# Patient Record
Sex: Female | Born: 1960 | Race: Black or African American | Hispanic: No | Smoking: Never smoker
Health system: Southern US, Community
[De-identification: ages and names within clinical notes are randomized; demographics above are authoritative.]

## PROBLEM LIST (undated history)

## (undated) DIAGNOSIS — M1990 Unspecified osteoarthritis, unspecified site: Secondary | ICD-10-CM

## (undated) DIAGNOSIS — D649 Anemia, unspecified: Secondary | ICD-10-CM

## (undated) DIAGNOSIS — I10 Essential (primary) hypertension: Secondary | ICD-10-CM

## (undated) DIAGNOSIS — F419 Anxiety disorder, unspecified: Secondary | ICD-10-CM

## (undated) HISTORY — PX: EYE SURGERY: SHX253

## (undated) HISTORY — PX: MYOMECTOMY: SHX85

## (undated) HISTORY — PX: COLONOSCOPY: SHX174

## (undated) HISTORY — PX: OTHER SURGICAL HISTORY: SHX169

---

## 1987-03-24 HISTORY — PX: TUBAL LIGATION: SHX77

## 1997-09-25 ENCOUNTER — Other Ambulatory Visit: Admission: RE | Admit: 1997-09-25 | Discharge: 1997-09-25 | Payer: Self-pay | Admitting: Obstetrics and Gynecology

## 1999-02-11 ENCOUNTER — Other Ambulatory Visit: Admission: RE | Admit: 1999-02-11 | Discharge: 1999-02-11 | Payer: Self-pay | Admitting: Obstetrics and Gynecology

## 2000-05-10 ENCOUNTER — Other Ambulatory Visit: Admission: RE | Admit: 2000-05-10 | Discharge: 2000-05-10 | Payer: Self-pay | Admitting: Obstetrics and Gynecology

## 2000-06-28 ENCOUNTER — Other Ambulatory Visit: Admission: RE | Admit: 2000-06-28 | Discharge: 2000-06-28 | Payer: Self-pay | Admitting: Obstetrics and Gynecology

## 2001-09-06 ENCOUNTER — Other Ambulatory Visit: Admission: RE | Admit: 2001-09-06 | Discharge: 2001-09-06 | Payer: Self-pay | Admitting: Obstetrics and Gynecology

## 2003-06-28 ENCOUNTER — Other Ambulatory Visit: Admission: RE | Admit: 2003-06-28 | Discharge: 2003-06-28 | Payer: Self-pay | Admitting: Obstetrics and Gynecology

## 2004-11-05 ENCOUNTER — Other Ambulatory Visit: Admission: RE | Admit: 2004-11-05 | Discharge: 2004-11-05 | Payer: Self-pay | Admitting: Obstetrics and Gynecology

## 2005-01-13 ENCOUNTER — Inpatient Hospital Stay (HOSPITAL_COMMUNITY): Admission: RE | Admit: 2005-01-13 | Discharge: 2005-01-15 | Payer: Self-pay | Admitting: Obstetrics and Gynecology

## 2005-01-13 ENCOUNTER — Encounter (INDEPENDENT_AMBULATORY_CARE_PROVIDER_SITE_OTHER): Payer: Self-pay | Admitting: Specialist

## 2005-12-09 ENCOUNTER — Other Ambulatory Visit: Admission: RE | Admit: 2005-12-09 | Discharge: 2005-12-09 | Payer: Self-pay | Admitting: Obstetrics and Gynecology

## 2006-05-26 ENCOUNTER — Ambulatory Visit: Payer: Self-pay | Admitting: Family Medicine

## 2007-03-14 ENCOUNTER — Ambulatory Visit: Payer: Self-pay | Admitting: Internal Medicine

## 2007-03-14 DIAGNOSIS — M25569 Pain in unspecified knee: Secondary | ICD-10-CM | POA: Insufficient documentation

## 2007-03-15 ENCOUNTER — Ambulatory Visit: Payer: Self-pay | Admitting: Internal Medicine

## 2007-03-21 ENCOUNTER — Telehealth: Payer: Self-pay | Admitting: Internal Medicine

## 2007-04-05 ENCOUNTER — Encounter: Payer: Self-pay | Admitting: Internal Medicine

## 2009-04-16 ENCOUNTER — Ambulatory Visit: Payer: Self-pay | Admitting: Internal Medicine

## 2009-04-16 DIAGNOSIS — J069 Acute upper respiratory infection, unspecified: Secondary | ICD-10-CM | POA: Insufficient documentation

## 2009-04-18 ENCOUNTER — Ambulatory Visit: Payer: Self-pay | Admitting: Internal Medicine

## 2009-04-18 ENCOUNTER — Telehealth: Payer: Self-pay | Admitting: Internal Medicine

## 2009-04-19 ENCOUNTER — Encounter: Payer: Self-pay | Admitting: Internal Medicine

## 2010-04-22 NOTE — Progress Notes (Signed)
Summary: shortness of breath  Phone Note Call from Patient Call back at [phone]   Caller: Patient------voice mail Reason for Call: Talk to Nurse Summary of Call: Was seen on Tuesday with bronchitis. Having shortness of breath. Pharmacy is [facility] on [address]. Please advise pt. Initial call taken by: Warnell Forester,  April 18, 2009 10:35 AM  Follow-up for Phone Call        coming in for appt Follow-up by: Raechel Ache, RN,  April 18, 2009 11:40 AM    generic z pack- 250 mg 5 day  (#6) ROV today if  worsens or desired

## 2010-04-22 NOTE — Assessment & Plan Note (Signed)
Summary: F/U ON CONGESTION, SOB // RS   Vital Signs:  Patient profile:   50 year old female O2 Sat:      100 % on Room air Temp:     98.9 degrees F oral Pulse rate:   84 / minute BP sitting:   102 / 84  (left arm) Cuff size:   regular  Vitals Entered By: Raechel Ache, RN (April 18, 2009 11:37 AM)  O2 Flow:  Room air CC: C/o SOB and still coughing.   CC:  C/o SOB and still coughing.Marland Kitchen  History of Present Illness: 51 year old patient who was seen two days ago and treated symptomatically for a URI.  For the past 24 hours.  She has noted increasing shortness of breath.  She still has a significant paroxysms of coughing, which remained nonproductive.  There's been no fever or chills.  She is considerable dyspnea with exertion.  Allergies: 1)  ! Pcn  Past History:  Past Medical History: Reviewed history from 03/14/2007 and no changes required. High Cholesterol gravida two, para two, abortus zero  Review of Systems       The patient complains of anorexia, dyspnea on exertion, and prolonged cough.  The patient denies fever, weight loss, weight gain, vision loss, decreased hearing, hoarseness, chest pain, syncope, peripheral edema, headaches, hemoptysis, abdominal pain, melena, severe indigestion/heartburn, hematuria, incontinence, genital sores, muscle weakness, suspicious skin lesions, transient blindness, difficulty walking, depression, unusual weight change, abnormal bleeding, enlarged lymph nodes, angioedema, and breast masses.    Physical Exam  General:  Well-developed,well-nourished,in no acute distress; alert,appropriate and cooperative throughout examination Head:  Normocephalic and atraumatic without obvious abnormalities. No apparent alopecia or balding. Eyes:  No corneal or conjunctival inflammation noted. EOMI. Perrla. Funduscopic exam benign, without hemorrhages, exudates or papilledema. Vision grossly normal. Ears:  External ear exam shows no significant lesions or  deformities.  Otoscopic examination reveals clear canals, tympanic membranes are intact bilaterally without bulging, retraction, inflammation or discharge. Hearing is grossly normal bilaterally. Mouth:  Oral mucosa and oropharynx without lesions or exudates.  Teeth in good repair. Neck:  No deformities, masses, or tenderness noted. Lungs:  patient has a mild resting tachypnea; she has frequent paroxysms of coughing and appears to be slightly anxious; her O2 saturation is 99 to a hundred with a resting pulse rate of 80  chest remains clear Heart:  Normal rate and regular rhythm. S1 and S2 normal without gallop, murmur, click, rub or other extra sounds. no tachycardia Abdomen:  Bowel sounds positive,abdomen soft and non-tender without masses, organomegaly or hernias noted. Msk:  No deformity or scoliosis noted of thoracic or lumbar spine.     Impression & Recommendations:  Problem # 1:  URI (ICD-465.9)  Her updated medication list for this problem includes:    Hydrocodone-homatropine 5-1.5 Mg/62ml Syrp (Hydrocodone-homatropine) .Marland Kitchen... 1 teaspoon every 6 hours as needed for cough    Her updated medication list for this problem includes:    Hydrocodone-homatropine 5-1.5 Mg/13ml Syrp (Hydrocodone-homatropine) .Marland Kitchen... 1 teaspoon every 6 hours as needed for cough  Orders: T-2 View CXR (71020TC)  Complete Medication List: 1)  Hydrocodone-homatropine 5-1.5 Mg/43ml Syrp (Hydrocodone-homatropine) .Marland Kitchen.. 1 teaspoon every 6 hours as needed for cough  Patient Instructions: 1)  Get plenty of rest, drink lots of clear liquids, and use Tylenol or Ibuprofen for fever and comfort. Return in 7-10 days if you're not better:sooner if you're feeling worse. 2)  chest x ray 3)  Dulera  one puff twice daily (do not  exceed)

## 2010-04-22 NOTE — Progress Notes (Signed)
  Phone Note Call from Patient   Caller: Patient Call For: k Summary of Call: needs note for work for Tues - Friday, pls mail to home Initial call taken by: Raechel Ache, RN,  April 18, 2009 2:48 PM  Follow-up for Phone Call        ok Follow-up by: Gordy Savers  MD,  April 19, 2009 12:36 PM  Additional Follow-up for Phone Call Additional follow up Details #1::        mailed. Additional Follow-up by: Raechel Ache, RN,  April 19, 2009 1:01 PM

## 2010-04-22 NOTE — Letter (Signed)
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   [place], [place] [address]   Phone: [phone]  Fax: [phone]    April 19, 2009   Employee:  Samantha Burnett    To Whom It May Concern:   For Medical reasons, please excuse the above named employee from work for the following dates:  Start:   04-16-2009  End:   04-20-2009  If you need additional information, please feel free to contact our office.         Sincerely,    Gordy Savers  MD

## 2010-04-22 NOTE — Assessment & Plan Note (Signed)
Summary: COUGH, CONGESTION // RS   Vital Signs:  Patient profile:   50 year old female Weight:      189 pounds Temp:     99.6 degrees F oral BP sitting:   100 / 76  (left arm) Cuff size:   regular  Vitals Entered By: Raechel Ache, RN (April 16, 2009 9:29 AM) CC: Got sick at 3am with fever, sore chest and productive cough.   CC:  Got sick at 3am with fever and sore chest and productive cough..  History of Present Illness: 50 year old patient who had the abrupt onset of fever and cough earlier today.  cough has  been large and nonproductive.  She is oriented developed some chest soreness related to the refractory cough.  She has had fever and some associated mild headache.  Denies any shortness of breath or significant purulent sputum production.  No pleurisy  Allergies: 1)  ! Pcn  Past History:  Past Medical History: Reviewed history from 03/14/2007 and no changes required. High Cholesterol gravida two, para two, abortus zero  Review of Systems       The patient complains of prolonged cough.  The patient denies anorexia, fever, weight loss, weight gain, vision loss, decreased hearing, hoarseness, chest pain, syncope, dyspnea on exertion, peripheral edema, headaches, hemoptysis, abdominal pain, melena, hematochezia, severe indigestion/heartburn, hematuria, incontinence, genital sores, muscle weakness, suspicious skin lesions, transient blindness, difficulty walking, depression, unusual weight change, abnormal bleeding, enlarged lymph nodes, angioedema, and breast masses.    Physical Exam  General:  overweight-appearing.  normal blood pressureoverweight-appearing.   Head:  Normocephalic and atraumatic without obvious abnormalities. No apparent alopecia or balding. Eyes:  No corneal or conjunctival inflammation noted. EOMI. Perrla. Funduscopic exam benign, without hemorrhages, exudates or papilledema. Vision grossly normal. Ears:  External ear exam shows no significant lesions  or deformities.  Otoscopic examination reveals clear canals, tympanic membranes are intact bilaterally without bulging, retraction, inflammation or discharge. Hearing is grossly normal bilaterally. Mouth:  Oral mucosa and oropharynx without lesions or exudates.  Teeth in good repair. Neck:  No deformities, masses, or tenderness noted. Chest Wall:  No deformities, masses, or tenderness noted. Lungs:  Normal respiratory effort, chest expands symmetrically. Lungs are clear to auscultation, no crackles or wheezes. Heart:  Normal rate and regular rhythm. S1 and S2 normal without gallop, murmur, click, rub or other extra sounds.   Impression & Recommendations:  Problem # 1:  URI (ICD-465.9)  The following medications were removed from the medication list:    Celebrex 200 Mg Caps (Celecoxib) ..... One twice daily Her updated medication list for this problem includes:    Hydrocodone-homatropine 5-1.5 Mg/33ml Syrp (Hydrocodone-homatropine) .Marland Kitchen... 1 teaspoon every 6 hours as needed for cough  The following medications were removed from the medication list:    Celebrex 200 Mg Caps (Celecoxib) ..... One twice daily Her updated medication list for this problem includes:    Hydrocodone-homatropine 5-1.5 Mg/51ml Syrp (Hydrocodone-homatropine) .Marland Kitchen... 1 teaspoon every 6 hours as needed for cough  Complete Medication List: 1)  Hydrocodone-homatropine 5-1.5 Mg/25ml Syrp (Hydrocodone-homatropine) .Marland Kitchen.. 1 teaspoon every 6 hours as needed for cough  Patient Instructions: 1)  Get plenty of rest, drink lots of clear liquids, and use Tylenol or Ibuprofen for fever and comfort. Return in 7-10 days if you're not better:sooner if you're feeling worse. 2)  MUCINEX DM ONE TWICE  DAILY Prescriptions: HYDROCODONE-HOMATROPINE 5-1.5 MG/5ML SYRP (HYDROCODONE-HOMATROPINE) 1 teaspoon every 6 hours as needed for cough  #6 oz  x 2   Entered and Authorized by:   Gordy Savers  MD   Signed by:   Gordy Savers  MD on  04/16/2009   Method used:   Print then Give to Patient   RxID:   [id number]

## 2010-08-08 NOTE — Op Note (Signed)
NAMEJOSEPHINE, WOOLDRIDGE                 ACCOUNT NO.:  [id number]   MEDICAL RECORD NO.:  [id number]          PATIENT TYPE:  INP   LOCATION:  9399                          FACILITY:  [facility]   PHYSICIAN:  Janine Limbo, M.D.DATE OF BIRTH:  [birth year]   DATE OF PROCEDURE:  01/13/2005  DATE OF DISCHARGE:                                 OPERATIVE REPORT   PREOPERATIVE DIAGNOSES:  1.  Fibroid uterus.  2.  Menorrhagia  3.  Dysmenorrhea.   POSTOPERATIVE DIAGNOSE:  1.  Fibroid uterus.  2.  Menorrhagia  3.  Dysmenorrhea.  4.  Adenomyosis.   PROCEDURE:  Abdominal myomectomy.   SURGEON:  Dr. Leonard Schwartz   FIRST ASSISTANT:  Osborn Coho, MD.   ANESTHETIC:  General.   DISPOSITION:  Ms. Lamour is a 50 year old female, para 2-0-0-2, who presents  for an abdominal myomectomy.  She has had a prior tubal ligation, but she  does not desire sterilization.  We talked about options for management which  include uterine artery embolization, hysterectomy, myomectomy, endometrial  ablation, and therapy with oral contraceptives or other hormonal-type  medications.  The patient has elected to proceed with myomectomy.  She  understands the indications for her surgical procedure and she accepts the  risks of, but not limited to, anesthetic complications, bleeding,  infections, and possible damage to the surrounding organs.   FINDINGS:  A total of 4 fibroids were removed.  The largest fibroid measured  3.5 cm in size.  The fallopian tubes and the ovaries were normal except for  defects in the fallopian tubes from her prior tubal ligation.  The remainder  of the bowel appeared normal.  There was thickening in the midportion of the  uterus, and this was thought to be consistent with adenomyosis.   PROCEDURE:  The patient was taken to the operating room where a general  anesthetic was given.  The patient's abdomen, perineum, and vagina were  prepped with multiple layers of Betadine.  A Foley  catheter was placed in  the bladder.  The patient was then sterilely draped.  The lower abdomen was  injected with 10 mL of 0.5% Marcaine with epinephrine.  A low transverse  incision was made and carried sharply through the subcutaneous tissue, the  fascia, and the anterior peritoneum.  The uterus was elevated into our  operative field.  Four fibroids were noted.  Two of the fibroids were  present on the anterior surface of the uterus and were subserosal in  location.  One fibroid was on the right fallopian tube, and one fibroid was  on the left fallopian tube.  The anterior uterus was injected with a diluted  solution of Pitressin and saline.  An incision was made in the midportion of  the uterus.  The incision was extended under the serosa to the level of the  fibroid.  Using a combination of sharp and blunt dissection, the fibroid was  removed to the right of that midline.  A similar technique was used to  remove the fibroid to the left of the midline.  There  was bleeding noted in  the crater of the uterus.  The uterus was then closed in layers using figure-  of-eight sutures of 0 Vicryl followed by a running suture of 3-0 Vicryl on  the serosa of the uterus.  Hemostasis was noted to be adequate.  The  fibroids on the fallopian tubes were removed using a combination of sharp  and blunt dissection.  Hemostasis was achieved using the bipolar cautery.  At this point, we felt that no other fibroids were present that needed to be  removed.  A sample of the midportion of the uterus had previously been  obtained to evaluate possible adenomyosis.  The pelvis was vigorously  irrigated.  Hemostasis was noted to be adequate.  All instruments were then  removed.  Intercede was placed on the anterior portion of the uterus  covering the incisions.  The anterior peritoneum was closed using a running  suture of 0 Vicryl.  The abdominal musculature was reapproximated in the  midline.  The fascia and the  subcutaneous layer were irrigated.  The fascia  was closed using a running suture of 0 Vicryl from the corners to the  midline in both directions.  The subcutaneous layer was irrigated.  A  Jackson-Pratt drain was placed in the subcutaneous layer and brought out  through the right lower quadrant.  It was sutured into place using 3-0 silk.  The subcutaneous layer was closed using 0 Vicryl.  The skin was  reapproximated using a subcuticular suture of 3-0 Monocryl.  Sponge, needle,  and instrument counts were correct on two occasions.  The estimated blood  loss was 100 mL.  The patient was noted to drain clear yellow urine at the  end of her procedure.  She was awakened from her anesthetic without  difficulty and taken to the recovery room in stable condition.      Janine Limbo, M.D.  Electronically Signed     AVS/MEDQ  D:  01/13/2005  T:  01/13/2005  Job:  [id number]

## 2010-08-08 NOTE — H&P (Signed)
NAMEBERTIE, MCCONATHY                 ACCOUNT NO.:  [id number]   MEDICAL RECORD NO.:  [id number]          PATIENT TYPE:  INP   LOCATION:  NA                            FACILITY:  [facility]   PHYSICIAN:  Janine Limbo, M.D.DATE OF BIRTH:  [birth year]   DATE OF ADMISSION:  01/13/2005  DATE OF DISCHARGE:                                HISTORY & PHYSICAL   HISTORY OF PRESENT ILLNESS:  Ms. Dohner is a 50 year old female, para 2-0-0-  2, who presents for an abdominal myomectomy because of fibroids,  menorrhagia, and dysmenorrhea.  The patient had an endometrial biopsy that  showed benign elements.  Her most recent Pap smear was within normal limits.   OBSTETRICAL HISTORY:  The patient has had 2 term vaginal delivery.   DRUG ALLERGIES:  PENICILLIN.   PAST MEDICAL HISTORY:  The patient was told that she had hypertension in the  past associated with birth control pills.  She has fibroids as mentioned  above.   SOCIAL HISTORY:  The patient denies cigarette use, alcohol use, and  recreational drug use.   REVIEW OF SYSTEMS:  Noncontributory.   FAMILY HISTORY:  The patient's grandfather had diabetes.  Her father had  cancer.  Her aunt and her sister have high blood pressure.   PHYSICAL EXAMINATION:  VITAL SIGNS:  Weight 188 pounds.  Height is 5 feet  1/2 inch.  HEENT: Within normal limits.  CHEST: Clear.  HEART: Regular rate and rhythm.  BREASTS:  Without masses.  ABDOMEN:  Nontender.  EXTREMITIES:  Grossly normal.  NEUROLOGIC:  Grossly normal.  PELVIC:  External genitalia normal.  Vagina normal.  Cervix is nontender.  Uterus is 10-week size, firm, slightly irregular.  Adnexal no masses.  Rectovaginal confirms.   LABORATORY VALUES:  Ultrasound showed a 10.7 x 6.2 cm uterus with fibroids  measuring 3.4 x 2.8 cm, 2.2 x 2.5 cm, and 1.4 x 1.1 cm.   GC negative, chlamydia negative, HIV negative, RPR negative, HBsAg negative.   ASSESSMENT:  1.  Menorrhagia.  2.  Dysmenorrhea.  3.  Fibroid  uterus.   PLAN:  The patient will undergo an abdominal myomectomy.  She understands  her options for management which include hysterectomy.  She does not want a  hysterectomy at this time.      Janine Limbo, M.D.  Electronically Signed     AVS/MEDQ  D:  01/12/2005  T:  01/12/2005  Job:  [id number]

## 2010-08-08 NOTE — Discharge Summary (Signed)
Samantha Burnett, Samantha Burnett                 ACCOUNT NO.:  [id number]   MEDICAL RECORD NO.:  [id number]          PATIENT TYPE:  INP   LOCATION:  9317                          FACILITY:  [facility]   PHYSICIAN:  Janine Limbo, M.D.DATE OF BIRTH:  1960-12-31   DATE OF ADMISSION:  01/13/2005  DATE OF DISCHARGE:  01/15/2005                                 DISCHARGE SUMMARY   DISCHARGE DIAGNOSIS:  Fibroid uterus, menorrhagia, dysmenorrhea.   OPERATION:  On the day of admission, the patient underwent an abdominal  myomectomy, tolerating procedure well. A total of four fibroids were removed  with the largest fibroid measuring 3.5 cm in size. The fallopian tubes and  the ovaries were normal except for defects in the fallopian tubes from her  prior tubal ligation. The remainder of her bowels appeared normal. There was  thickening in the mid portion of the uterus and this was thought to be  consistent with adenomyosis.   HISTORY OF PRESENT ILLNESS:  Samantha Burnett is a 50 year old female para 2-0-0-2  who presents for an abdominal myomectomy because of symptomatic uterine  fibroids. Please see the patient's dictated history and physical examination  for details.   PREOPERATIVE PHYSICAL EXAMINATION:  VITAL SIGNS:  Weight is 188 pounds,  height is 5 feet one-half inches tall.  GENERAL:  Within normal limits.  PELVIC:  External genitalia was normal. Vagina normal. Cervix was nontender.  Uterus 10 weeks size, firm and slightly irregular. Adnexa no masses.  Rectovaginal exam confirm.   HOSPITAL COURSE:  On the day of admission the patient underwent  aforementioned procedure, tolerating it well. Postoperative course was  unremarkable, with the patient tolerating a postoperative hemoglobin of 12.0  (preoperative hemoglobin 13.8). By postoperative day #2, the patient had  resumed bowel and bladder function and was therefore deemed ready for  discharge home.   DISCHARGE MEDICATIONS:  1.  Vicodin one or two  tablets every 4 hours as needed for pain.  2.  Ibuprofen 600 mg one tablet with food every 6 hours for 3 days, then as      needed for pain.  3.  Phenergan 25 mg one tablet every 6 hours as needed for nausea.  4.  Colace 100 mg one tablet twice daily until bowel movements are regular.   FOLLOW-UP:  The patient is to call [facility] in 1 week for  scheduling of removal of her JP drain. She has a 6 weeks postoperative visit  with Dr. Stefano Gaul which also should be scheduled at that time.   DISCHARGE INSTRUCTIONS:  The patient was given a copy of [facility] postoperative instruction sheet. She was further advised to avoid  driving for 2 weeks, heavy lifting for 4 weeks, intercourse for 6 weeks,  that she may shower, that she may walk up steps, and that she should  definitely keep her incision clean and dry. The patient's diet was without  restriction.   FINAL PATHOLOGY:  Uterus biopsy:  Benign myometrium, no adenomyosis  identified. Uterus myomectomy:  Fragments of leiomyoma, a single fragment of  smooth muscle  with granular epithelium.      [place]mira J. Adline Peals.      Janine Limbo, M.D.  Electronically Signed    EJP/MEDQ  D:  02/06/2005  T:  02/06/2005  Job:  [id number]

## 2011-08-19 ENCOUNTER — Telehealth: Payer: Self-pay | Admitting: Obstetrics and Gynecology

## 2011-08-19 ENCOUNTER — Encounter: Payer: Self-pay | Admitting: Obstetrics and Gynecology

## 2011-08-19 ENCOUNTER — Ambulatory Visit (INDEPENDENT_AMBULATORY_CARE_PROVIDER_SITE_OTHER): Payer: BC Managed Care – HMO | Admitting: Obstetrics and Gynecology

## 2011-08-19 VITALS — BP 108/78 | HR 72 | Ht 60.0 in | Wt 189.0 lb

## 2011-08-19 DIAGNOSIS — N951 Menopausal and female climacteric states: Secondary | ICD-10-CM

## 2011-08-19 DIAGNOSIS — E669 Obesity, unspecified: Secondary | ICD-10-CM | POA: Insufficient documentation

## 2011-08-19 DIAGNOSIS — Z9889 Other specified postprocedural states: Secondary | ICD-10-CM

## 2011-08-19 DIAGNOSIS — I10 Essential (primary) hypertension: Secondary | ICD-10-CM

## 2011-08-19 DIAGNOSIS — Z01419 Encounter for gynecological examination (general) (routine) without abnormal findings: Secondary | ICD-10-CM

## 2011-08-19 DIAGNOSIS — Z124 Encounter for screening for malignant neoplasm of cervix: Secondary | ICD-10-CM

## 2011-08-19 NOTE — Patient Instructions (Signed)

## 2011-08-19 NOTE — Progress Notes (Signed)
Last Pap: 06/14/08 WNL: Yes Regular Periods:yes Contraception: BTL  Monthly Breast exam:yes Tetanus<29yrs:yes Nl.Bladder Function:yes Daily BMs:yes Healthy Diet:no Calcium:yes Mammogram:yes 06/2011 per pt Exercise:yes Seatbelt: yes Abuse at home: no Stressful work:no Sigmoid-colonoscopy: 08/31/2010 per pt Bone Density: No Pt has questions about menopause. Samantha Burnett  Subjective:    Samantha Burnett is a 51 y.o. female,para 2-0-0-2, who presents for an annual exam. See above. The patient had a myomectomy in 08/30/2004. She complains of menopausal symptoms.  Her husband died in 08/30/2008.  She is slowly recovering.  She is not sexually active.  Prior Hysterectomy: No    History   Social History  . Marital Status: Married    Spouse Name: N/A    Number of Children: N/A  . Years of Education: N/A   Social History Main Topics  . Smoking status: Never Smoker   . Smokeless tobacco: Never Used  . Alcohol Use: No  . Drug Use: No  . Sexually Active: Yes    Birth Control/ Protection: Surgical     BTL   Other Topics Concern  . None   Social History Narrative  . None    Menstrual cycle:   LMP: Patient's last menstrual period was 07/15/2011.           Cycle: Regular, monthly with normal flow and no severe dysmenorrha  The following portions of the patient's history were reviewed and updated as appropriate: allergies, current medications, past family history, past medical history, past social history, past surgical history and problem list.  Review of Systems Pertinent items are noted in HPI. Breast:Negative for breast lump,nipple discharge or nipple retraction Gastrointestinal: Negative for abdominal pain, change in bowel habits or rectal bleeding Urinary:negative   Objective:    BP 108/78  Pulse 72  Ht 5' (1.524 m)  Wt 189 lb (85.73 kg)  BMI 36.91 kg/m2  LMP 07/15/2011    Weight:  Wt Readings from Last 1 Encounters:  08/19/11 189 lb (85.73 kg)          BMI: Body mass index  is 36.91 kg/(m^2).  General Appearance: Alert, appropriate appearance for age. No acute distress HEENT: Grossly normal Neck / Thyroid: Supple, no masses, nodes or enlargement Lungs: clear to auscultation bilaterally Back: No CVA tenderness Breast Exam: No masses or nodes.No dimpling, nipple retraction or discharge. Cardiovascular: Regular rate and rhythm. S1, S2, no murmur Gastrointestinal: Soft, non-tender, no masses or organomegaly  ++++++++++++++++++++++++++++++++++++++++++++++++++++++++  Pelvic Exam: External genitalia: normal general appearance Vaginal: normal without tenderness, induration or masses and relaxation noted Cervix: normal appearance Adnexa: normal bimanual exam Uterus: normal size shape and consistency Rectovaginal: normal rectal, no masses  ++++++++++++++++++++++++++++++++++++++++++++++++++++++++  Lymphatic Exam: Non-palpable nodes in neck, clavicular, axillary, or inguinal regions Neurologic: Normal speech, no tremor  Psychiatric: Alert and oriented, appropriate affect.   Wet Prep:not applicable Urinalysis:not applicable UPT: Not done   Assessment:    Normal gyn exam menopausal symptoms   Overweight or obese: Yes   Pelvic relaxation: Yes   Plan:    mammogram pap smear return annually or prn Contraception:abstinence    STD screen request: none  RPR: No.   HBsAg: No.  Hepatitis C: No.  Management of menopause reviewed.  Hormone replacement discussed.  Herbal medications, clonidine, and SSRI therapy outlined.  The patient will try black cohosh and call if she does not get adequate improvement.  The updated Pap smear screening guidelines were discussed with the patient. The patient requested that I obtain a Pap smear: Yes.  Kegel exercises discussed: Yes.  Proper diet and regular exercise were reviewed.  Annual mammograms recommended starting at age 62. Proper breast care was discussed.  Screening colonoscopy is recommended beginning at  age 37.  Regular health maintenance was reviewed.  Sleep hygiene was discussed.  Adequate calcium and vitamin D intake was emphasized.  Mylinda Latina.D.

## 2011-08-23 LAB — PAP IG W/ RFLX HPV ASCU

## 2011-10-12 ENCOUNTER — Other Ambulatory Visit: Payer: Self-pay | Admitting: Internal Medicine

## 2011-10-12 DIAGNOSIS — M5430 Sciatica, unspecified side: Secondary | ICD-10-CM

## 2011-10-16 ENCOUNTER — Inpatient Hospital Stay: Admission: RE | Admit: 2011-10-16 | Payer: Self-pay | Source: Ambulatory Visit

## 2011-10-29 ENCOUNTER — Encounter (HOSPITAL_COMMUNITY): Payer: Self-pay | Admitting: Pharmacy Technician

## 2011-11-02 ENCOUNTER — Other Ambulatory Visit: Payer: Self-pay | Admitting: Neurosurgery

## 2011-11-06 MED ORDER — OXYCODONE-ACETAMINOPHEN 5-325 MG PO TABS
ORAL_TABLET | ORAL | Status: AC
  Filled 2011-11-06: qty 2

## 2011-11-06 MED ORDER — DIAZEPAM 5 MG PO TABS
ORAL_TABLET | ORAL | Status: AC
  Filled 2011-11-06: qty 1

## 2011-11-13 NOTE — Pre-Procedure Instructions (Signed)
20 Samantha Burnett  11/13/2011   Your procedure is scheduled on:  Friday, August 30th.  Report to [facility] at 1:00PM.  Call this number if you have problems the morning of surgery: [phone]   Remember:   Do not eat food or drink any liquid:After Midnight.      Take these medicines the morning of surgery with A SIP OF WATER:  You may take  Hydrocodone-Acetaminophen (Vicodin) if needed.  Stop taking Ibuprofen and all Herbal medications.  Do not take any NSAIDs.   Do not wear jewelry, make-up or nail polish.  Do not wear lotions, powders, or perfumes.   Do not shave 48 hours prior to surgery.   Do not bring valuables to the hospital.  Contacts, dentures or bridgework may not be worn into surgery.  Leave suitcase in the car. After surgery it may be brought to your room.  For patients admitted to the hospital, checkout time is 11:00 AM the day of discharge.   Patients discharged the day of surgery will not be allowed to drive home.  Name and phone number of your driver:    Special Instructions: CHG Shower Use Special Wash: 1/2 bottle night before surgery and 1/2 bottle morning of surgery.   Please read over the following fact sheets that you were given: Pain Booklet, Coughing and Deep Breathing and Surgical Site Infection Prevention

## 2011-11-16 ENCOUNTER — Encounter (HOSPITAL_COMMUNITY)
Admission: RE | Admit: 2011-11-16 | Discharge: 2011-11-16 | Disposition: A | Discharge status: Home / Self Care | Payer: BC Managed Care – PPO | Source: Ambulatory Visit | Attending: Neurosurgery | Admitting: Neurosurgery

## 2011-11-16 ENCOUNTER — Encounter (HOSPITAL_COMMUNITY): Payer: Self-pay

## 2011-11-16 HISTORY — DX: Unspecified osteoarthritis, unspecified site: M19.90

## 2011-11-16 HISTORY — DX: Essential (primary) hypertension: I10

## 2011-11-16 HISTORY — DX: Anemia, unspecified: D64.9

## 2011-11-16 LAB — CBC
HCT: 41.6 % (ref 36.0–46.0)
Hemoglobin: 14.3 g/dL (ref 12.0–15.0)
MCH: 31.8 pg (ref 26.0–34.0)
MCHC: 34.4 g/dL (ref 30.0–36.0)
RDW: 13.1 % (ref 11.5–15.5)

## 2011-11-16 LAB — BASIC METABOLIC PANEL
BUN: 6 mg/dL (ref 6–23)
Creatinine, Ser: 0.64 mg/dL (ref 0.50–1.10)
GFR calc non Af Amer: >90 mL/min (ref >90)
Glucose, Bld: 93 mg/dL (ref 70–99)
Potassium: 3.3 mEq/L — ABNORMAL LOW (ref 3.5–5.1)

## 2011-11-16 LAB — SURGICAL PCR SCREEN
MRSA, PCR: NEGATIVE
Staphylococcus aureus: NEGATIVE

## 2011-11-16 NOTE — Progress Notes (Signed)
Patient denied having a stress test, cardiac cath, or sleep study.  

## 2011-11-19 MED ORDER — VANCOMYCIN HCL IN DEXTROSE 1-5 GM/200ML-% IV SOLN
1000.0000 mg | INTRAVENOUS | Status: AC
  Administered 2011-11-20: 1000 mg via INTRAVENOUS
  Filled 2011-11-19: qty 200

## 2011-11-20 ENCOUNTER — Encounter (HOSPITAL_COMMUNITY): Payer: Self-pay | Admitting: Anesthesiology

## 2011-11-20 ENCOUNTER — Ambulatory Visit (HOSPITAL_COMMUNITY)
Admission: RE | Admit: 2011-11-20 | Discharge: 2011-11-21 | Disposition: A | Discharge status: Home / Self Care | Payer: BC Managed Care – PPO | Source: Ambulatory Visit | Attending: Neurosurgery | Admitting: Neurosurgery

## 2011-11-20 ENCOUNTER — Ambulatory Visit (HOSPITAL_COMMUNITY): Payer: BC Managed Care – PPO | Admitting: Anesthesiology

## 2011-11-20 ENCOUNTER — Encounter (HOSPITAL_COMMUNITY): Payer: Self-pay | Admitting: *Deleted

## 2011-11-20 ENCOUNTER — Encounter (HOSPITAL_COMMUNITY)
Admission: RE | Disposition: A | Discharge status: Home / Self Care | Payer: Self-pay | Source: Ambulatory Visit | Attending: Neurosurgery

## 2011-11-20 ENCOUNTER — Ambulatory Visit (HOSPITAL_COMMUNITY): Payer: BC Managed Care – PPO

## 2011-11-20 DIAGNOSIS — Z01810 Encounter for preprocedural cardiovascular examination: Secondary | ICD-10-CM | POA: Insufficient documentation

## 2011-11-20 DIAGNOSIS — Z01812 Encounter for preprocedural laboratory examination: Secondary | ICD-10-CM | POA: Insufficient documentation

## 2011-11-20 DIAGNOSIS — I10 Essential (primary) hypertension: Secondary | ICD-10-CM | POA: Insufficient documentation

## 2011-11-20 DIAGNOSIS — M4714 Other spondylosis with myelopathy, thoracic region: Secondary | ICD-10-CM | POA: Insufficient documentation

## 2011-11-20 DIAGNOSIS — Z01818 Encounter for other preprocedural examination: Secondary | ICD-10-CM | POA: Insufficient documentation

## 2011-11-20 SURGERY — THORACIC DISCECTOMY
Anesthesia: General | Site: Spine Thoracic | Laterality: Bilateral | Wound class: Clean

## 2011-11-20 MED ORDER — HYDROMORPHONE HCL PF 1 MG/ML IJ SOLN
INTRAMUSCULAR | Status: AC
Start: 2011-11-20 — End: 2011-11-20
  Administered 2011-11-20: 0.5 mg via INTRAVENOUS
  Filled 2011-11-20: qty 1

## 2011-11-20 MED ORDER — THROMBIN 5000 UNITS EX KIT
PACK | CUTANEOUS | Status: DC | PRN
  Administered 2011-11-20 (×2): 5000 [IU] via TOPICAL

## 2011-11-20 MED ORDER — DEXAMETHASONE SODIUM PHOSPHATE 10 MG/ML IJ SOLN
INTRAMUSCULAR | Status: AC
  Administered 2011-11-20: 10 mg via INTRAVENOUS
  Filled 2011-11-20: qty 1

## 2011-11-20 MED ORDER — LIDOCAINE-EPINEPHRINE 1 %-1:100000 IJ SOLN
INTRAMUSCULAR | Status: DC | PRN
  Administered 2011-11-20: 10 mL

## 2011-11-20 MED ORDER — LIDOCAINE HCL 4 % MT SOLN
OROMUCOSAL | Status: DC | PRN
  Administered 2011-11-20: 4 mL via TOPICAL

## 2011-11-20 MED ORDER — GELATIN ABSORBABLE MT POWD
OROMUCOSAL | Status: DC | PRN
  Administered 2011-11-20: 17:00:00 via TOPICAL

## 2011-11-20 MED ORDER — SODIUM CHLORIDE 0.9 % IR SOLN
Status: DC | PRN
  Administered 2011-11-20: 15:00:00

## 2011-11-20 MED ORDER — ONDANSETRON HCL 4 MG/2ML IJ SOLN: 4.0000 mg | INTRAMUSCULAR | Status: DC | PRN

## 2011-11-20 MED ORDER — LACTATED RINGERS IV SOLN
INTRAVENOUS | Status: DC | PRN
  Administered 2011-11-20 (×2): via INTRAVENOUS

## 2011-11-20 MED ORDER — PHENOL 1.4 % MT LIQD: 1.0000 | OROMUCOSAL | Status: DC | PRN

## 2011-11-20 MED ORDER — IBUPROFEN 600 MG PO TABS
600.0000 mg | ORAL_TABLET | Freq: Four times a day (QID) | ORAL | Status: DC | PRN
  Filled 2011-11-20: qty 1

## 2011-11-20 MED ORDER — VANCOMYCIN HCL IN DEXTROSE 1-5 GM/200ML-% IV SOLN
1000.0000 mg | Freq: Once | INTRAVENOUS | Status: AC
  Administered 2011-11-21: 1000 mg via INTRAVENOUS
  Filled 2011-11-20: qty 200

## 2011-11-20 MED ORDER — HYDROCHLOROTHIAZIDE 12.5 MG PO CAPS
12.5000 mg | ORAL_CAPSULE | Freq: Every day | ORAL | Status: DC
  Administered 2011-11-21: 12.5 mg via ORAL
  Filled 2011-11-20: qty 1

## 2011-11-20 MED ORDER — ONDANSETRON HCL 4 MG/2ML IJ SOLN
INTRAMUSCULAR | Status: DC | PRN
  Administered 2011-11-20: 4 mg via INTRAVENOUS

## 2011-11-20 MED ORDER — CYCLOBENZAPRINE HCL 10 MG PO TABS
10.0000 mg | ORAL_TABLET | Freq: Three times a day (TID) | ORAL | Status: DC | PRN
  Administered 2011-11-20 – 2011-11-21 (×2): 10 mg via ORAL
  Filled 2011-11-20 (×2): qty 1

## 2011-11-20 MED ORDER — CEFAZOLIN SODIUM 1-5 GM-% IV SOLN: 1.0000 g | Freq: Three times a day (TID) | INTRAVENOUS | Status: DC

## 2011-11-20 MED ORDER — HYDROMORPHONE HCL PF 1 MG/ML IJ SOLN
INTRAMUSCULAR | Status: AC
  Administered 2011-11-20: 0.5 mg via INTRAVENOUS
  Filled 2011-11-20: qty 1

## 2011-11-20 MED ORDER — MENTHOL 3 MG MT LOZG: 1.0000 | LOZENGE | OROMUCOSAL | Status: DC | PRN

## 2011-11-20 MED ORDER — SODIUM CHLORIDE 0.9 % IJ SOLN: 3.0000 mL | INTRAMUSCULAR | Status: DC | PRN

## 2011-11-20 MED ORDER — HYDROCHLOROTHIAZIDE 25 MG PO TABS: 12.5000 mg | ORAL_TABLET | Freq: Every day | ORAL | Status: DC

## 2011-11-20 MED ORDER — PROPOFOL 10 MG/ML IV BOLUS
INTRAVENOUS | Status: DC | PRN
  Administered 2011-11-20: 200 mg via INTRAVENOUS

## 2011-11-20 MED ORDER — HYDROMORPHONE HCL PF 1 MG/ML IJ SOLN
0.5000 mg | INTRAMUSCULAR | Status: DC | PRN
  Administered 2011-11-21: 1 mg via INTRAVENOUS
  Filled 2011-11-20: qty 1

## 2011-11-20 MED ORDER — LIDOCAINE HCL (CARDIAC) 20 MG/ML IV SOLN
INTRAVENOUS | Status: DC | PRN
  Administered 2011-11-20: 100 mg via INTRAVENOUS

## 2011-11-20 MED ORDER — ROCURONIUM BROMIDE 100 MG/10ML IV SOLN
INTRAVENOUS | Status: DC | PRN
  Administered 2011-11-20: 10 mg via INTRAVENOUS
  Administered 2011-11-20: 50 mg via INTRAVENOUS
  Administered 2011-11-20 (×2): 10 mg via INTRAVENOUS

## 2011-11-20 MED ORDER — ONDANSETRON HCL 4 MG/2ML IJ SOLN
4.0000 mg | Freq: Once | INTRAMUSCULAR | Status: AC | PRN
  Administered 2011-11-20: 4 mg via INTRAVENOUS

## 2011-11-20 MED ORDER — NEOSTIGMINE METHYLSULFATE 1 MG/ML IJ SOLN
INTRAMUSCULAR | Status: DC | PRN
  Administered 2011-11-20: 3 mg via INTRAVENOUS

## 2011-11-20 MED ORDER — GLYCOPYRROLATE 0.2 MG/ML IJ SOLN
INTRAMUSCULAR | Status: DC | PRN
  Administered 2011-11-20: .5 mg via INTRAVENOUS

## 2011-11-20 MED ORDER — ARTIFICIAL TEARS OP OINT
TOPICAL_OINTMENT | OPHTHALMIC | Status: DC | PRN
  Administered 2011-11-20: 1 via OPHTHALMIC

## 2011-11-20 MED ORDER — PHENYLEPHRINE HCL 10 MG/ML IJ SOLN
INTRAMUSCULAR | Status: DC | PRN
  Administered 2011-11-20: 80 ug via INTRAVENOUS
  Administered 2011-11-20: 40 ug via INTRAVENOUS

## 2011-11-20 MED ORDER — SODIUM CHLORIDE 0.9 % IV SOLN
INTRAVENOUS | Status: AC
  Filled 2011-11-20: qty 500

## 2011-11-20 MED ORDER — BUPIVACAINE HCL (PF) 0.25 % IJ SOLN
INTRAMUSCULAR | Status: DC | PRN
  Administered 2011-11-20: 10 mL

## 2011-11-20 MED ORDER — HYDROMORPHONE HCL PF 1 MG/ML IJ SOLN
0.2500 mg | INTRAMUSCULAR | Status: DC | PRN
  Administered 2011-11-20 (×6): 0.5 mg via INTRAVENOUS

## 2011-11-20 MED ORDER — ONDANSETRON HCL 4 MG/2ML IJ SOLN
INTRAMUSCULAR | Status: AC
  Administered 2011-11-20: 4 mg via INTRAVENOUS
  Filled 2011-11-20: qty 2

## 2011-11-20 MED ORDER — HYDROCODONE-ACETAMINOPHEN 5-325 MG PO TABS: 1.0000 | ORAL_TABLET | ORAL | Status: DC | PRN

## 2011-11-20 MED ORDER — ONDANSETRON HCL 4 MG/2ML IJ SOLN: 4.0000 mg | Freq: Once | INTRAMUSCULAR | Status: DC | PRN

## 2011-11-20 MED ORDER — BACITRACIN 50000 UNITS IM SOLR
INTRAMUSCULAR | Status: AC
Start: 2011-11-20 — End: 2011-11-21
  Filled 2011-11-20: qty 1

## 2011-11-20 MED ORDER — SODIUM CHLORIDE 0.9 % IV SOLN: 250.0000 mL | INTRAVENOUS | Status: DC

## 2011-11-20 MED ORDER — OXYCODONE-ACETAMINOPHEN 5-325 MG PO TABS
1.0000 | ORAL_TABLET | ORAL | Status: DC | PRN
  Administered 2011-11-20 – 2011-11-21 (×3): 2 via ORAL
  Filled 2011-11-20 (×3): qty 2

## 2011-11-20 MED ORDER — HEMOSTATIC AGENTS (NO CHARGE) OPTIME
TOPICAL | Status: DC | PRN
  Administered 2011-11-20: 1 via TOPICAL

## 2011-11-20 MED ORDER — SODIUM CHLORIDE 0.9 % IJ SOLN: 3.0000 mL | Freq: Two times a day (BID) | INTRAMUSCULAR | Status: DC

## 2011-11-20 MED ORDER — MIDAZOLAM HCL 5 MG/5ML IJ SOLN
INTRAMUSCULAR | Status: DC | PRN
  Administered 2011-11-20: 2 mg via INTRAVENOUS

## 2011-11-20 MED ORDER — DEXAMETHASONE SODIUM PHOSPHATE 10 MG/ML IJ SOLN: 10.0000 mg | INTRAMUSCULAR | Status: DC

## 2011-11-20 MED ORDER — HYDROMORPHONE HCL PF 1 MG/ML IJ SOLN: 0.2500 mg | INTRAMUSCULAR | Status: DC | PRN

## 2011-11-20 MED ORDER — DEXTROSE 5 % IV SOLN
INTRAVENOUS | Status: DC | PRN
  Administered 2011-11-20: 15:00:00 via INTRAVENOUS

## 2011-11-20 MED ORDER — PREDNISONE 10 MG PO TABS
10.0000 mg | ORAL_TABLET | Freq: Every day | ORAL | Status: DC
  Filled 2011-11-20 (×2): qty 1

## 2011-11-20 MED ORDER — 0.9 % SODIUM CHLORIDE (POUR BTL) OPTIME
TOPICAL | Status: DC | PRN
  Administered 2011-11-20: 1000 mL

## 2011-11-20 MED ORDER — FENTANYL CITRATE 0.05 MG/ML IJ SOLN
INTRAMUSCULAR | Status: DC | PRN
  Administered 2011-11-20 (×2): 50 ug via INTRAVENOUS
  Administered 2011-11-20: 100 ug via INTRAVENOUS
  Administered 2011-11-20: 50 ug via INTRAVENOUS

## 2011-11-20 MED ORDER — ACETAMINOPHEN 325 MG PO TABS: 650.0000 mg | ORAL_TABLET | ORAL | Status: DC | PRN

## 2011-11-20 MED ORDER — ACETAMINOPHEN 650 MG RE SUPP: 650.0000 mg | RECTAL | Status: DC | PRN

## 2011-11-20 MED ORDER — FERROUS SULFATE 325 (65 FE) MG PO TABS
325.0000 mg | ORAL_TABLET | Freq: Every day | ORAL | Status: DC
  Administered 2011-11-21: 325 mg via ORAL
  Filled 2011-11-20 (×2): qty 1

## 2011-11-20 SURGICAL SUPPLY — 60 items
ADH SKN CLS APL DERMABOND .7 (GAUZE/BANDAGES/DRESSINGS) ×1
APL SKNCLS STERI-STRIP NONHPOA (GAUZE/BANDAGES/DRESSINGS) ×1
BAG DECANTER FOR FLEXI CONT (MISCELLANEOUS) ×2 IMPLANT
BENZOIN TINCTURE PRP APPL 2/3 (GAUZE/BANDAGES/DRESSINGS) ×2 IMPLANT
BLADE SURG 11 STRL SS (BLADE) ×2 IMPLANT
BLADE SURG ROTATE 9660 (MISCELLANEOUS) IMPLANT
BRUSH SCRUB EZ PLAIN DRY (MISCELLANEOUS) ×2 IMPLANT
BUR MATCHSTICK NEURO 3.0 LAGG (BURR) ×2 IMPLANT
BUR PRECISION FLUTE 6.0 (BURR) ×2 IMPLANT
CANISTER SUCTION 2500CC (MISCELLANEOUS) ×2 IMPLANT
CLOTH BEACON ORANGE TIMEOUT ST (SAFETY) ×2 IMPLANT
CONT SPEC 4OZ CLIKSEAL STRL BL (MISCELLANEOUS) ×2 IMPLANT
DECANTER SPIKE VIAL GLASS SM (MISCELLANEOUS) ×2 IMPLANT
DERMABOND ADVANCED (GAUZE/BANDAGES/DRESSINGS) ×1
DERMABOND ADVANCED .7 DNX12 (GAUZE/BANDAGES/DRESSINGS) ×1 IMPLANT
DRAPE LAPAROTOMY 100X72X124 (DRAPES) ×2 IMPLANT
DRAPE MICROSCOPE ZEISS OPMI (DRAPES) ×2 IMPLANT
DRAPE POUCH INSTRU U-SHP 10X18 (DRAPES) ×2 IMPLANT
DRAPE PROXIMA HALF (DRAPES) IMPLANT
DRAPE SURG 17X23 STRL (DRAPES) ×2 IMPLANT
DRSG OPSITE 4X5.5 SM (GAUZE/BANDAGES/DRESSINGS) ×2 IMPLANT
ELECT REM PT RETURN 9FT ADLT (ELECTROSURGICAL) ×2
ELECTRODE REM PT RTRN 9FT ADLT (ELECTROSURGICAL) ×1 IMPLANT
EVACUATOR 1/8 PVC DRAIN (DRAIN) ×1 IMPLANT
GAUZE SPONGE 4X4 16PLY XRAY LF (GAUZE/BANDAGES/DRESSINGS) IMPLANT
GLOVE BIO SURGEON STRL SZ 6.5 (GLOVE) ×2 IMPLANT
GLOVE BIO SURGEON STRL SZ8 (GLOVE) ×2 IMPLANT
GLOVE BIOGEL PI IND STRL 7.0 (GLOVE) IMPLANT
GLOVE BIOGEL PI IND STRL 8 (GLOVE) IMPLANT
GLOVE BIOGEL PI INDICATOR 7.0 (GLOVE) ×2
GLOVE BIOGEL PI INDICATOR 8 (GLOVE) ×1
GLOVE ECLIPSE 7.5 STRL STRAW (GLOVE) ×1 IMPLANT
GLOVE EXAM NITRILE LRG STRL (GLOVE) IMPLANT
GLOVE EXAM NITRILE MD LF STRL (GLOVE) IMPLANT
GLOVE EXAM NITRILE XL STR (GLOVE) IMPLANT
GLOVE EXAM NITRILE XS STR PU (GLOVE) IMPLANT
GLOVE INDICATOR 8.5 STRL (GLOVE) ×2 IMPLANT
GLOVE SS BIOGEL STRL SZ 6.5 (GLOVE) IMPLANT
GLOVE SUPERSENSE BIOGEL SZ 6.5 (GLOVE) ×1
GOWN BRE IMP SLV AUR LG STRL (GOWN DISPOSABLE) ×2 IMPLANT
GOWN BRE IMP SLV AUR XL STRL (GOWN DISPOSABLE) ×4 IMPLANT
GOWN STRL REIN 2XL LVL4 (GOWN DISPOSABLE) IMPLANT
HEMOSTAT POWDER SURGIFOAM 1G (HEMOSTASIS) ×1 IMPLANT
KIT BASIN OR (CUSTOM PROCEDURE TRAY) ×2 IMPLANT
KIT ROOM TURNOVER OR (KITS) ×2 IMPLANT
NEEDLE HYPO 22GX1.5 SAFETY (NEEDLE) ×2 IMPLANT
NS IRRIG 1000ML POUR BTL (IV SOLUTION) ×2 IMPLANT
PACK LAMINECTOMY NEURO (CUSTOM PROCEDURE TRAY) ×2 IMPLANT
RUBBERBAND STERILE (MISCELLANEOUS) ×4 IMPLANT
SPONGE GAUZE 4X4 12PLY (GAUZE/BANDAGES/DRESSINGS) ×2 IMPLANT
SPONGE SURGIFOAM ABS GEL SZ50 (HEMOSTASIS) ×2 IMPLANT
STRIP CLOSURE SKIN 1/2X4 (GAUZE/BANDAGES/DRESSINGS) ×2 IMPLANT
SUT VIC AB 0 CT1 18XCR BRD8 (SUTURE) ×1 IMPLANT
SUT VIC AB 0 CT1 8-18 (SUTURE) ×2
SUT VIC AB 2-0 CT1 18 (SUTURE) ×2 IMPLANT
SUT VICRYL 4-0 PS2 18IN ABS (SUTURE) ×2 IMPLANT
SYR 20ML ECCENTRIC (SYRINGE) ×2 IMPLANT
TOWEL OR 17X24 6PK STRL BLUE (TOWEL DISPOSABLE) ×2 IMPLANT
TOWEL OR 17X26 10 PK STRL BLUE (TOWEL DISPOSABLE) ×2 IMPLANT
WATER STERILE IRR 1000ML POUR (IV SOLUTION) ×2 IMPLANT

## 2011-11-20 NOTE — Anesthesia Procedure Notes (Signed)
Procedure Name: Intubation Date/Time: 11/20/2011 2:58 PM Performed by: Julianne Rice Z Pre-anesthesia Checklist: Patient identified, Emergency Drugs available, Suction available, Patient being monitored and Timeout performed Patient Re-evaluated:Patient Re-evaluated prior to inductionOxygen Delivery Method: Circle system utilized Preoxygenation: Pre-oxygenation with 100% oxygen Intubation Type: Combination inhalational/ intravenous induction Ventilation: Mask ventilation without difficulty Laryngoscope Size: Mac and 3 Grade View: Grade II Tube type: Oral Tube size: 7.5 mm Number of attempts: 1 Airway Equipment and Method: Stylet and LTA kit utilized Placement Confirmation: ETT inserted through vocal cords under direct vision,  positive ETCO2 and breath sounds checked- equal and bilateral Secured at: 22 cm Tube secured with: Tape Dental Injury: Teeth and Oropharynx as per pre-operative assessment  Comments: No change in front left crown.

## 2011-11-20 NOTE — Anesthesia Postprocedure Evaluation (Signed)
  Anesthesia Post-op Note  Patient: Samantha Burnett  Procedure(s) Performed: Procedure(s) (LRB): THORACIC DISCECTOMY (Bilateral)  Patient Location: PACU  Anesthesia Type: General  Level of Consciousness: awake  Airway and Oxygen Therapy: Patient Spontanous Breathing and Patient connected to nasal cannula oxygen  Post-op Pain: none  Post-op Assessment: Post-op Vital signs reviewed, Patient's Cardiovascular Status Stable, Respiratory Function Stable, Patent Airway and No signs of Nausea or vomiting  Post-op Vital Signs: Reviewed and stable  Complications: No apparent anesthesia complications

## 2011-11-20 NOTE — Transfer of Care (Signed)
Immediate Anesthesia Transfer of Care Note  Patient: Samantha Burnett  Procedure(s) Performed: Procedure(s) (LRB): THORACIC DISCECTOMY (Bilateral)  Patient Location: PACU  Anesthesia Type: General  Level of Consciousness: awake, alert  and oriented  Airway & Oxygen Therapy: Patient Spontanous Breathing and Patient connected to nasal cannula oxygen  Post-op Assessment: Report given to PACU RN, Post -op Vital signs reviewed and stable and Patient moving all extremities X 4  Post vital signs: Reviewed and stable  Complications: No apparent anesthesia complications

## 2011-11-20 NOTE — H&P (Signed)
Samantha Burnett is an 51 y.o. female.   Chief Complaint: Back and predominantly left leg pain with numbness and tingling that goes down her entire left leg HPI: Doing of them is at rest worsening back and left leg pain with numbness tingling going to her leg and sometimes her leg giving way weakness. There is a physical exam was consistent with a myelopathy workup revealed thoracic spinal cord compression due to large osteophytes at T11 at T10-11 disc space at T12 and the teeth 1112 disc space. The patient progressed clinical syndrome and imaging findings spinal cord compression she was recommended thoracic laminectomy and decompression with her as well as other person with her as well as perioperative course expectations about alternatives of surgery she understands and agrees to proceed forward.  Past Medical History  Diagnosis Date  . Hypertension   . Arthritis   . Anemia     Past Surgical History  Procedure Date  . Myomectomy   . Tubal ligation 1989  . Eye surgery     lasick   . Colonoscopy     History reviewed. No pertinent family history. Social History:  reports that she has never smoked. She has never used smokeless tobacco. She reports that she does not drink alcohol or use illicit drugs.  Allergies:  Allergies  Allergen Reactions  . Penicillins Hives and Itching    Medications Prior to Admission  Medication Sig Dispense Refill  . B Complex Vitamins (VITAMIN B-COMPLEX PO) Take 1 tablet by mouth daily.       Marland Kitchen BLACK COHOSH PO Take 1 tablet by mouth once a week.       . ferrous sulfate 325 (65 FE) MG tablet Take 325 mg by mouth daily with breakfast.      . hydrochlorothiazide (HYDRODIURIL) 12.5 MG tablet Take 12.5 mg by mouth daily.      Marland Kitchen HYDROcodone-acetaminophen (NORCO/VICODIN) 5-325 MG per tablet Take 1 tablet by mouth 4 (four) times daily as needed. For pain      . ibuprofen (ADVIL,MOTRIN) 600 MG tablet Take 600 mg by mouth every 6 (six) hours as needed. For pain      .  Multiple Vitamins-Minerals (MULTIVITAMIN WITH MINERALS) tablet Take 1 tablet by mouth daily.      . predniSONE (DELTASONE) 10 MG tablet Take 10 mg by mouth daily. Taper schedule. Will finish on Sat 8/11      . ST JOHNS WORT PO Take 1 tablet by mouth every other day.         No results found for this or any previous visit (from the past 48 hour(s)). No results found.  Review of Systems  Constitutional: Negative.   HENT: Negative.   Eyes: Negative.   Respiratory: Negative.   Cardiovascular: Negative.   Gastrointestinal: Negative.   Genitourinary: Negative.   Musculoskeletal: Positive for back pain.  Skin: Negative.   Neurological: Positive for tremors and sensory change.  Psychiatric/Behavioral: Negative.     Blood pressure 111/81, pulse 82, temperature 98.1 F (36.7 C), temperature source Oral, resp. rate 20, SpO2 96.00%. Physical Exam  Constitutional: She appears well-developed.  HENT:  Head: Normocephalic.  Eyes: Pupils are equal, round, and reactive to light.  Neck: Normal range of motion.  Cardiovascular: Normal rate.   Respiratory: Effort normal.  GI: Soft.  Neurological: She is alert. She has normal strength. GCS eye subscore is 4. GCS verbal subscore is 5. GCS motor subscore is 6.  Reflex Scores:      Bicep  reflexes are 2+ on the right side and 2+ on the left side.      Brachioradialis reflexes are 2+ on the right side and 2+ on the left side.      Patellar reflexes are 3+ on the right side and 3+ on the left side.      Achilles reflexes are 3+ on the right side and 3+ on the left side.      Strength is 5 out of 5 in her iliopsoas, quads, hip she's, gastrocs, anterior tibialis, and EHL.     Assessment/Plan 51 year old female presents for T10-11 T11-12 thoracic decompressive laminectomy  Samantha Burnett P 11/20/2011, 2:20 PM

## 2011-11-20 NOTE — Preoperative (Signed)
Beta Blockers   Reason not to administer Beta Blockers:Not Applicable 

## 2011-11-20 NOTE — Anesthesia Preprocedure Evaluation (Addendum)
Anesthesia Evaluation  Patient identified by MRN, date of birth, ID band Patient awake    Reviewed: Allergy & Precautions, H&P , NPO status , Patient's Chart, lab work & pertinent test results  Airway Mallampati: I TM Distance: >3 FB Neck ROM: Full    Dental  (+) Teeth Intact and Dental Advisory Given Crowns upper; Pt states left front crown is loose; dental advisory given in detail; Pt. Understands that if crown dislodged it will be saved for her to take to dentist for receding:   Pulmonary neg pulmonary ROS,  breath sounds clear to auscultation        Cardiovascular hypertension, Pt. on medications Rhythm:Regular Rate:Normal     Neuro/Psych    GI/Hepatic   Endo/Other    Renal/GU      Musculoskeletal  (+) Arthritis -, Osteoarthritis,    Abdominal   Peds  Hematology   Anesthesia Other Findings   Reproductive/Obstetrics                         Anesthesia Physical Anesthesia Plan  ASA: II  Anesthesia Plan: General   Post-op Pain Management:    Induction: Intravenous  Airway Management Planned: Oral ETT  Additional Equipment:   Intra-op Plan:   Post-operative Plan: Extubation in OR  Informed Consent: I have reviewed the patients History and Physical, chart, labs and discussed the procedure including the risks, benefits and alternatives for the proposed anesthesia with the patient or authorized representative who has indicated his/her understanding and acceptance.   Dental advisory given  Plan Discussed with: Anesthesiologist, Surgeon and CRNA  Anesthesia Plan Comments:        Anesthesia Quick Evaluation

## 2011-11-20 NOTE — Op Note (Signed)
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  EBL: Minimal  History of present illness: Patient is very pleasant 51 year old him as a progress worsening back pain but pain with numbness tingling goes down her left leg with occasionally her left leg giving way on her her exam was consistent with a myelopathy and imaging revealed severe thoracic spinal cord compression at T10-11 T11-T12 from a large spurs coming off the facet complexes at these respective levels. Patient also had a thoracic radiating pain and T11-T12 distributions. Patient failed conservative and with progressing of clinical syndrome and imaging findings patient recommended decompression procedures at T11-12 and T10-11. Risks benefits of the operation as well as perioperative course expectations about alternatives surgery were all spine the patient she understood and agreed to proceed forward.  Operative procedure: Patient brought into the or was induced in general anesthesia and positioned prone on the Wilson frame her back was prepped and draped in routine sterile fashion. Preoperative localizing appropriate level so after infiltration of 10 cc lidocaine with epi a midline incision was made and Bovie left car was used taken the subcutaneous tissues and subperiosteal dissections care on the lamina of T10-T11 and T12 bilaterally. Then after interoperative x-ray confirmed the appropriate level the spinous processes at T10 and T11 were removed central decompression was begun and then the  great partially taking off the lamina on the right side to gain access the predominance of the laminectomy was carried out towards the left at this point the operating next of drape running field limits illumination the facet complexes and a large osteophytes were causing severe hourglass compression of thecal sac from the left side these were dissected off of the dura under mask agglutination removed in piecemeal fashion with a 1 to the Kerrison punch a high-speed drill. The T10 and T11-T12 nerve roots were identified under biting the facet complexes remove the degenerated facet is to help decompress and these foramina. At T11-T12 part of this osteophyte was also consistent with an degenerated synovial cyst. At the end of the resection of both osteophytes and cystic mass there is no further stenosis or compression spinal cord as well as the foramina at T10-11 and 12 oral decompressed. The wounds and copiously irrigated meticulous in a stasis was maintained Gelfoam was laid up the dura a medium Hemovac drain was placed the wounds closed in layers with after Vicryl and the skin was closed with a running 4 subcuticular benzo and Steri-Strips were applied patient recovered in stable condition. At the end of case on it counts sponge counts were correct.

## 2011-11-21 MED ORDER — OXYCODONE-ACETAMINOPHEN 5-325 MG PO TABS: 1.0000 | ORAL_TABLET | ORAL | Status: AC | PRN

## 2011-11-21 MED ORDER — CYCLOBENZAPRINE HCL 10 MG PO TABS: 10.0000 mg | ORAL_TABLET | Freq: Three times a day (TID) | ORAL | Status: AC | PRN

## 2011-11-21 NOTE — Progress Notes (Signed)
Subjective: Patient reports feeling better.    Objective: Vital signs in last 24 hours: Temp:  [97.9 F (36.6 C)-98.3 F (36.8 C)] 98.2 F (36.8 C) (08/31 0815) Pulse Rate:  [66-88] 77  (08/31 0815) Resp:  [12-20] 18  (08/31 0815) BP: (92-118)/(53-82) 93/53 mmHg (08/31 0815) SpO2:  [93 %-100 %] 94 % (08/31 0815)  Intake/Output from previous day: 08/30 0701 - 08/31 0700 In: 2050 [I.V.:2050] Out: 100 [Blood:100] Intake/Output this shift:    Physical Exam: Still some left leg numbness, but less urinary urgency and feels stronger in legs. Dressing CDI.  Lab Results: No results found for this basename: WBC:2,HGB:2,HCT:2,PLT:2 in the last 72 hours BMET No results found for this basename: NA:2,K:2,CL:2,CO2:2,GLUCOSE:2,BUN:2,CREATININE:2,CALCIUM:2 in the last 72 hours  Studies/Results: Dg Thoracolumabar Spine  11/20/2011  *RADIOLOGY REPORT*  Clinical Data: Surgical decompression at T10-11 and T11-12.  THORACOLUMBAR SPINE - 2 VIEW  Comparison: None.  Findings: Intraoperative images were obtained at 1525 and 1640 hours.  The first demonstrates placement of a needle posteriorly lying between the spinous processes of T12 and L1. The second film demonstrates placement of instruments and retractors opposite the T11 vertebral body.  IMPRESSION: Intraoperative localization of the lower thoracic spine during surgical decompression as above.   Original Report Authenticated By: Reola Calkins, M.D.     Assessment/Plan: Doing well.  D/C home.    LOS: 1 day    Dorian Heckle, MD 11/21/2011, 8:56 AM

## 2011-11-21 NOTE — Discharge Summary (Signed)
Physician Discharge Summary  Patient ID: Samantha Burnett MRN: [id number] DOB/AGE: [birth year] 51 y.o.  Admit date: 11/20/2011 Discharge date: 11/21/2011  Admission Diagnoses:  Discharge Diagnoses:  Active Problems:  * No active hospital problems. *    Discharged Condition: good  Hospital Course: Uncomplicated thoracic laminectomy for spinal stenosis and myelopathy  Consults: None  Significant Diagnostic Studies: None  Treatments: surgery: thoracic laminectomy for spinal stenosis and myelopathy  Discharge Exam: Blood pressure 93/53, pulse 77, temperature 98.2 F (36.8 C), temperature source Oral, resp. rate 18, SpO2 94.00%. Neurologic: Alert and oriented X 3, normal strength and tone. Normal symmetric reflexes. Normal coordination and gait Wound:CDI  Disposition: Home   Medication List  As of 11/21/2011  9:01 AM   STOP taking these medications         predniSONE 10 MG tablet         TAKE these medications         BLACK COHOSH PO   Take 1 tablet by mouth once a week.      cyclobenzaprine 10 MG tablet   Commonly known as: FLEXERIL   Take 1 tablet (10 mg total) by mouth 3 (three) times daily as needed for muscle spasms.      ferrous sulfate 325 (65 FE) MG tablet   Take 325 mg by mouth daily with breakfast.      hydrochlorothiazide 12.5 MG tablet   Commonly known as: HYDRODIURIL   Take 12.5 mg by mouth daily.      HYDROcodone-acetaminophen 5-325 MG per tablet   Commonly known as: NORCO/VICODIN   Take 1 tablet by mouth 4 (four) times daily as needed. For pain      ibuprofen 600 MG tablet   Commonly known as: ADVIL,MOTRIN   Take 600 mg by mouth every 6 (six) hours as needed. For pain      multivitamin with minerals tablet   Take 1 tablet by mouth daily.      oxyCODONE-acetaminophen 5-325 MG per tablet   Commonly known as: PERCOCET/ROXICET   Take 1-2 tablets by mouth every 4 (four) hours as needed.      ST JOHNS WORT PO   Take 1 tablet by mouth every other  day.      VITAMIN B-COMPLEX PO   Take 1 tablet by mouth daily.             Signed: Dorian Heckle, MD 11/21/2011, 9:01 AM

## 2013-10-18 IMAGING — CR DG CHEST 2V
2 series · 2 of 2 positions shown · non-contrast
Comparison: 04/18/2009

CLINICAL DATA: Thoracic decompression.  Preop radiograph.

CHEST - 2 VIEW

[view not recorded (1 of 2)]
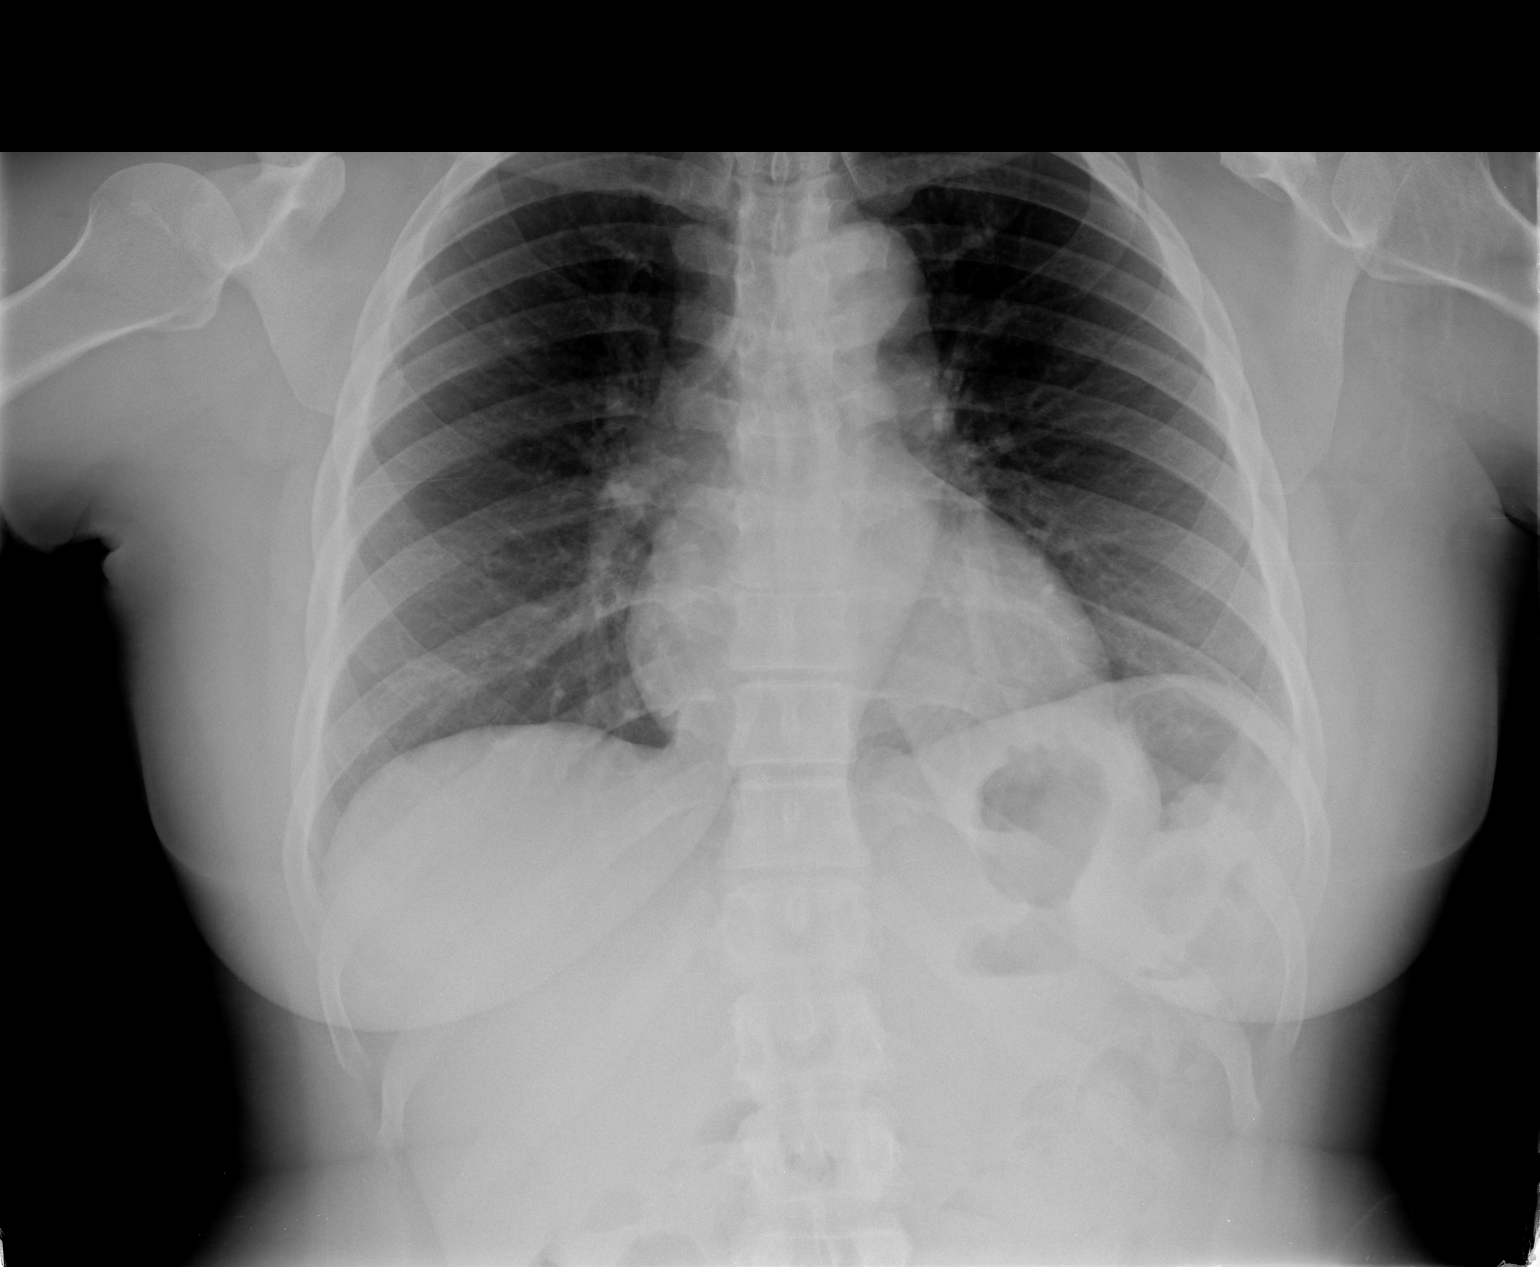

[view not recorded (2 of 2)]
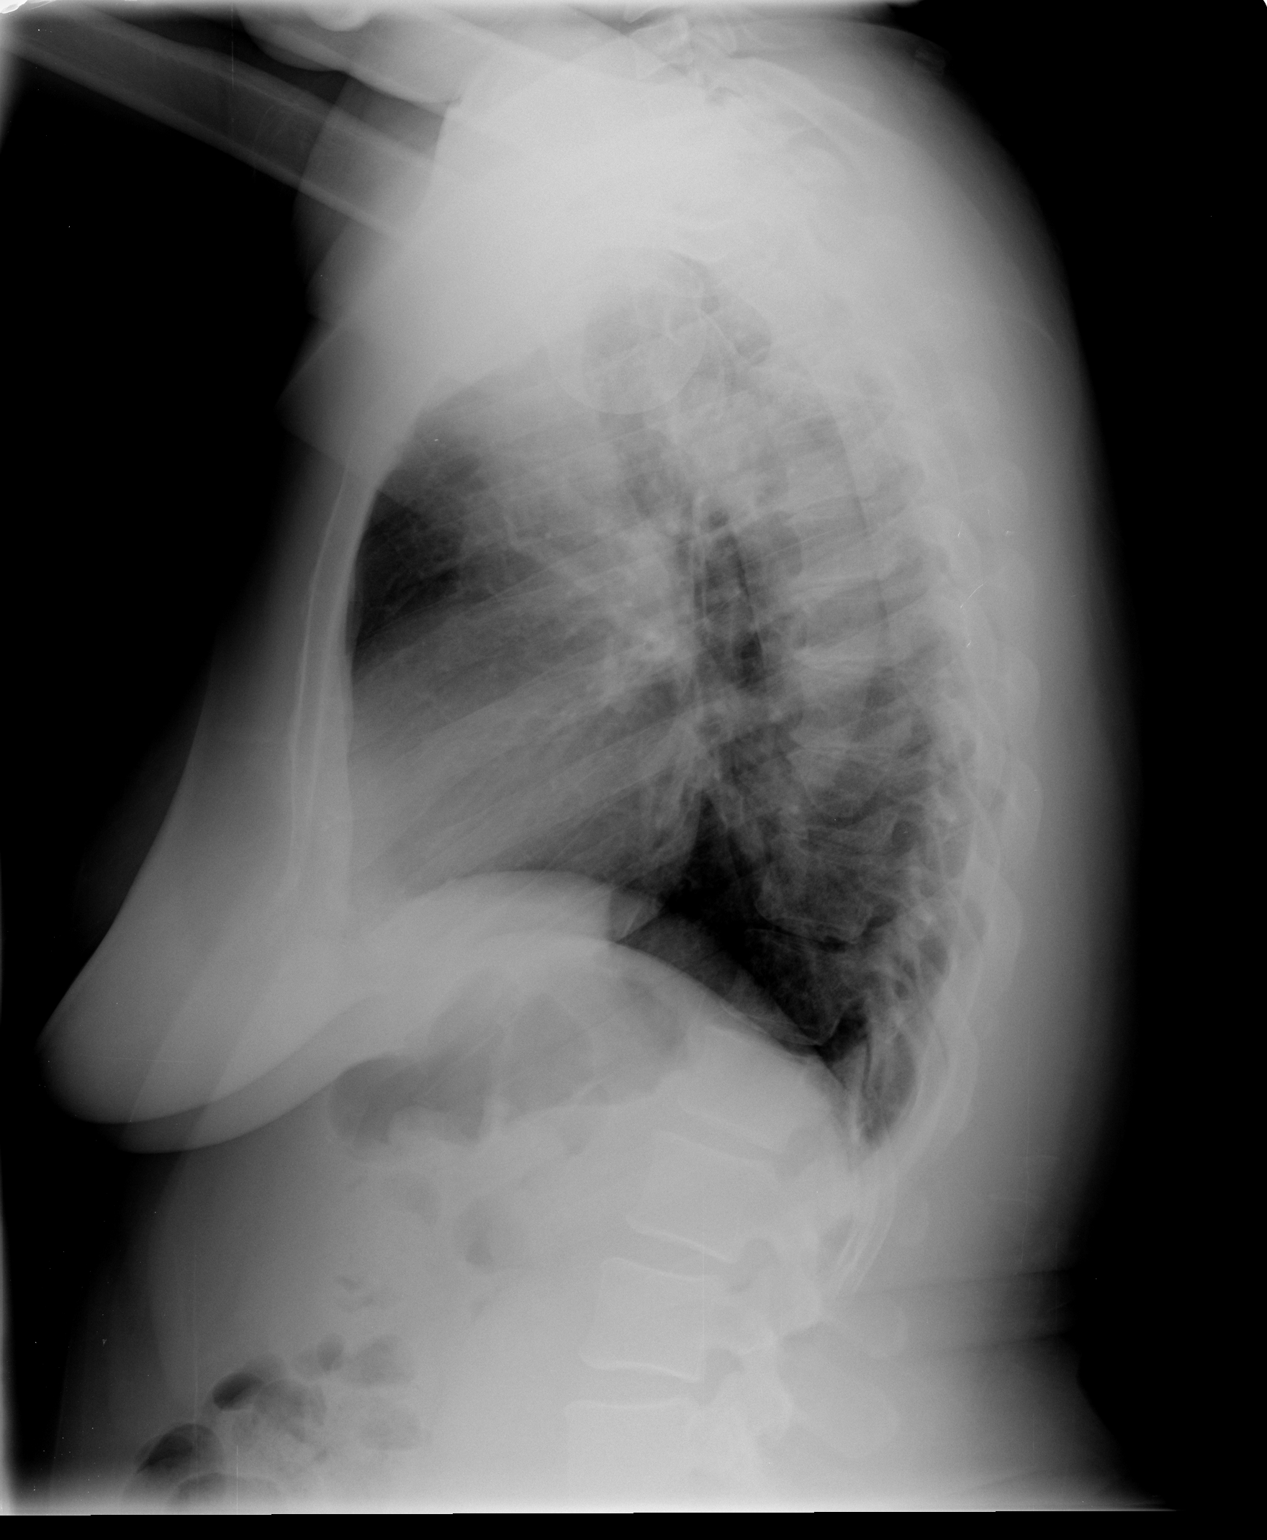

[2 of 2 positions shown; findings below may reference images not displayed]

FINDINGS: The heart size and mediastinal contours are within normal
limits.  Both lungs are clear.  The visualized skeletal structures
are unremarkable.
IMPRESSION: Negative exam.

## 2015-03-27 ENCOUNTER — Other Ambulatory Visit: Payer: Self-pay | Admitting: Internal Medicine

## 2015-03-27 ENCOUNTER — Ambulatory Visit
Admission: RE | Admit: 2015-03-27 | Discharge: 2015-03-27 | Disposition: A | Discharge status: Home / Self Care | Payer: Managed Care, Other (non HMO) | Source: Ambulatory Visit | Attending: Internal Medicine | Admitting: Internal Medicine

## 2015-03-27 DIAGNOSIS — J209 Acute bronchitis, unspecified: Secondary | ICD-10-CM

## 2015-05-21 ENCOUNTER — Other Ambulatory Visit: Payer: Self-pay | Admitting: Neurosurgery

## 2015-07-18 NOTE — Pre-Procedure Instructions (Signed)
    Samantha DoveWilla S Burnett  07/18/2015      [id number] Samantha Burnett- [place], [place] - [address] [place] [id number] Phone: [phone] Fax: [phone]    Your procedure is scheduled on 07/29/15.  [facility] at [facility].M.  Call this number if you have problems the morning of surgery:  [phone]   Remember:  Do not eat food or drink liquids after midnight.  Take these medicines the morning of surgery with A SIP OF WATER --hydrocodone   Do not wear jewelry, make-up or nail polish.  Do not wear lotions, powders, or perfumes.  You may wear deodorant.  Do not shave 48 hours prior to surgery.  Men may shave face and neck.  Do not bring valuables to the hospital.  [facility] is not responsible for any belongings or valuables.  Contacts, dentures or bridgework may not be worn into surgery.  Leave your suitcase in the car.  After surgery it may be brought to your room.  For patients admitted to the hospital, discharge time will be determined by your treatment team.  Patients discharged the day of surgery will not be allowed to drive home.   Name and phone number of your driver:   Special instructions:   Please read over the following fact sheets that you were given. Pain Booklet, Coughing and Deep Breathing, MRSA Information and Surgical Site Infection Prevention

## 2015-07-19 ENCOUNTER — Encounter (HOSPITAL_COMMUNITY)
Admission: RE | Admit: 2015-07-19 | Discharge: 2015-07-19 | Disposition: A | Discharge status: Home / Self Care | Payer: Managed Care, Other (non HMO) | Source: Ambulatory Visit | Attending: Neurosurgery | Admitting: Neurosurgery

## 2015-07-19 ENCOUNTER — Encounter (HOSPITAL_COMMUNITY): Payer: Self-pay

## 2015-07-19 DIAGNOSIS — R9431 Abnormal electrocardiogram [ECG] [EKG]: Secondary | ICD-10-CM | POA: Diagnosis not present

## 2015-07-19 DIAGNOSIS — I498 Other specified cardiac arrhythmias: Secondary | ICD-10-CM | POA: Insufficient documentation

## 2015-07-19 DIAGNOSIS — I10 Essential (primary) hypertension: Secondary | ICD-10-CM | POA: Insufficient documentation

## 2015-07-19 DIAGNOSIS — Z01818 Encounter for other preprocedural examination: Secondary | ICD-10-CM | POA: Insufficient documentation

## 2015-07-19 DIAGNOSIS — M4310 Spondylolisthesis, site unspecified: Secondary | ICD-10-CM | POA: Diagnosis not present

## 2015-07-19 DIAGNOSIS — Z0183 Encounter for blood typing: Secondary | ICD-10-CM | POA: Insufficient documentation

## 2015-07-19 DIAGNOSIS — Z01812 Encounter for preprocedural laboratory examination: Secondary | ICD-10-CM | POA: Diagnosis not present

## 2015-07-19 HISTORY — DX: Anxiety disorder, unspecified: F41.9

## 2015-07-19 LAB — ABO/RH: ABO/RH(D): O POS

## 2015-07-19 LAB — TYPE AND SCREEN
ABO/RH(D): O POS
ANTIBODY SCREEN: NEGATIVE

## 2015-07-19 LAB — BASIC METABOLIC PANEL
ANION GAP: 10 (ref 5–15)
BUN: 11 mg/dL (ref 6–20)
CO2: 25 mmol/L (ref 22–32)
Calcium: 9.5 mg/dL (ref 8.9–10.3)
Chloride: 101 mmol/L (ref 101–111)
Creatinine, Ser: 0.78 mg/dL (ref 0.44–1.00)
GFR calc Af Amer: >60 mL/min (ref >60)
GFR calc non Af Amer: >60 mL/min (ref >60)
GLUCOSE: 99 mg/dL (ref 65–99)
POTASSIUM: 3.2 mmol/L — AB (ref 3.5–5.1)
Sodium: 136 mmol/L (ref 135–145)

## 2015-07-19 LAB — CBC
HEMATOCRIT: 42.2 % (ref 36.0–46.0)
HEMOGLOBIN: 13.6 g/dL (ref 12.0–15.0)
MCH: 29.3 pg (ref 26.0–34.0)
MCHC: 32.2 g/dL (ref 30.0–36.0)
MCV: 90.9 fL (ref 78.0–100.0)
Platelets: 308 10*3/uL (ref 150–400)
RBC: 4.64 MIL/uL (ref 3.87–5.11)
RDW: 15 % (ref 11.5–15.5)
WBC: 8 10*3/uL (ref 4.0–10.5)

## 2015-07-19 LAB — SURGICAL PCR SCREEN
MRSA, PCR: NEGATIVE
Staphylococcus aureus: NEGATIVE

## 2015-07-19 LAB — HCG, SERUM, QUALITATIVE: PREG SERUM: NEGATIVE

## 2015-07-29 ENCOUNTER — Encounter (HOSPITAL_COMMUNITY): Payer: Self-pay | Admitting: Certified Registered Nurse Anesthetist

## 2015-07-29 ENCOUNTER — Inpatient Hospital Stay (HOSPITAL_COMMUNITY): Payer: Managed Care, Other (non HMO) | Admitting: Emergency Medicine

## 2015-07-29 ENCOUNTER — Inpatient Hospital Stay (HOSPITAL_COMMUNITY): Payer: Managed Care, Other (non HMO)

## 2015-07-29 ENCOUNTER — Inpatient Hospital Stay (HOSPITAL_COMMUNITY)
Admission: RE | Admit: 2015-07-29 | Discharge: 2015-07-30 | DRG: 460 | Disposition: A | Discharge status: Home / Self Care | Payer: Managed Care, Other (non HMO) | Source: Ambulatory Visit | Attending: Neurosurgery | Admitting: Neurosurgery

## 2015-07-29 ENCOUNTER — Encounter (HOSPITAL_COMMUNITY)
Admission: RE | Disposition: A | Discharge status: Home / Self Care | Payer: Self-pay | Source: Ambulatory Visit | Attending: Neurosurgery

## 2015-07-29 ENCOUNTER — Inpatient Hospital Stay (HOSPITAL_COMMUNITY): Payer: Managed Care, Other (non HMO) | Admitting: Certified Registered Nurse Anesthetist

## 2015-07-29 DIAGNOSIS — M549 Dorsalgia, unspecified: Secondary | ICD-10-CM | POA: Diagnosis present

## 2015-07-29 DIAGNOSIS — F419 Anxiety disorder, unspecified: Secondary | ICD-10-CM | POA: Diagnosis present

## 2015-07-29 DIAGNOSIS — I10 Essential (primary) hypertension: Secondary | ICD-10-CM | POA: Diagnosis present

## 2015-07-29 DIAGNOSIS — M7130 Other bursal cyst, unspecified site: Secondary | ICD-10-CM | POA: Diagnosis present

## 2015-07-29 DIAGNOSIS — Z419 Encounter for procedure for purposes other than remedying health state, unspecified: Secondary | ICD-10-CM

## 2015-07-29 DIAGNOSIS — M4316 Spondylolisthesis, lumbar region: Secondary | ICD-10-CM | POA: Diagnosis present

## 2015-07-29 SURGERY — POSTERIOR LUMBAR FUSION 2 LEVEL
Anesthesia: General | Site: Back

## 2015-07-29 MED ORDER — CEFAZOLIN SODIUM-DEXTROSE 2-4 GM/100ML-% IV SOLN: 2.0000 g | Freq: Three times a day (TID) | INTRAVENOUS | Status: DC

## 2015-07-29 MED ORDER — HYDROMORPHONE HCL 1 MG/ML IJ SOLN
INTRAMUSCULAR | Status: AC
  Filled 2015-07-29: qty 1

## 2015-07-29 MED ORDER — FENTANYL CITRATE (PF) 250 MCG/5ML IJ SOLN
INTRAMUSCULAR | Status: AC
  Filled 2015-07-29: qty 5

## 2015-07-29 MED ORDER — SUGAMMADEX SODIUM 200 MG/2ML IV SOLN
INTRAVENOUS | Status: DC | PRN
  Administered 2015-07-29: 300 mg via INTRAVENOUS

## 2015-07-29 MED ORDER — HYDROCODONE-ACETAMINOPHEN 5-325 MG PO TABS
1.0000 | ORAL_TABLET | ORAL | Status: DC | PRN
  Administered 2015-07-29 – 2015-07-30 (×2): 1 via ORAL
  Filled 2015-07-29 (×2): qty 1

## 2015-07-29 MED ORDER — MENTHOL 3 MG MT LOZG: 1.0000 | LOZENGE | OROMUCOSAL | Status: DC | PRN

## 2015-07-29 MED ORDER — VANCOMYCIN HCL IN DEXTROSE 1-5 GM/200ML-% IV SOLN
1000.0000 mg | Freq: Once | INTRAVENOUS | Status: AC
Start: 2015-07-29 — End: 2015-07-29
  Administered 2015-07-29: 1000 mg via INTRAVENOUS
  Filled 2015-07-29: qty 200

## 2015-07-29 MED ORDER — ADULT MULTIVITAMIN W/MINERALS CH
1.0000 | ORAL_TABLET | Freq: Every day | ORAL | Status: DC
  Filled 2015-07-29: qty 1

## 2015-07-29 MED ORDER — MIDAZOLAM HCL 2 MG/2ML IJ SOLN
INTRAMUSCULAR | Status: AC
  Filled 2015-07-29: qty 2

## 2015-07-29 MED ORDER — DEXAMETHASONE SODIUM PHOSPHATE 10 MG/ML IJ SOLN
10.0000 mg | INTRAMUSCULAR | Status: AC
  Administered 2015-07-29: 10 mg via INTRAVENOUS
  Filled 2015-07-29: qty 1

## 2015-07-29 MED ORDER — ARTIFICIAL TEARS OP OINT
TOPICAL_OINTMENT | OPHTHALMIC | Status: AC
  Filled 2015-07-29: qty 3.5

## 2015-07-29 MED ORDER — FERROUS SULFATE 325 (65 FE) MG PO TABS: 325.0000 mg | ORAL_TABLET | Freq: Every day | ORAL | Status: DC | PRN

## 2015-07-29 MED ORDER — SODIUM CHLORIDE 0.9 % IR SOLN
Status: DC | PRN
  Administered 2015-07-29: 09:00:00

## 2015-07-29 MED ORDER — EPHEDRINE 5 MG/ML INJ
INTRAVENOUS | Status: AC
  Filled 2015-07-29: qty 10

## 2015-07-29 MED ORDER — PROPOFOL 10 MG/ML IV BOLUS
INTRAVENOUS | Status: AC
  Filled 2015-07-29: qty 20

## 2015-07-29 MED ORDER — HYDROMORPHONE HCL 1 MG/ML IJ SOLN
0.5000 mg | INTRAMUSCULAR | Status: DC | PRN
  Administered 2015-07-29 – 2015-07-30 (×4): 1 mg via INTRAVENOUS
  Filled 2015-07-29 (×4): qty 1

## 2015-07-29 MED ORDER — PHENOL 1.4 % MT LIQD
1.0000 | OROMUCOSAL | Status: DC | PRN
Start: 2015-07-29 — End: 2015-07-30

## 2015-07-29 MED ORDER — CYCLOBENZAPRINE HCL 10 MG PO TABS
10.0000 mg | ORAL_TABLET | Freq: Three times a day (TID) | ORAL | Status: DC | PRN
  Administered 2015-07-29 – 2015-07-30 (×3): 10 mg via ORAL
  Filled 2015-07-29 (×2): qty 1

## 2015-07-29 MED ORDER — ROCURONIUM BROMIDE 50 MG/5ML IV SOLN
INTRAVENOUS | Status: AC
  Filled 2015-07-29: qty 2

## 2015-07-29 MED ORDER — SODIUM CHLORIDE 0.9% FLUSH: 3.0000 mL | INTRAVENOUS | Status: DC | PRN

## 2015-07-29 MED ORDER — MIDAZOLAM HCL 5 MG/5ML IJ SOLN
INTRAMUSCULAR | Status: DC | PRN
  Administered 2015-07-29: 2 mg via INTRAVENOUS

## 2015-07-29 MED ORDER — PHENYLEPHRINE HCL 10 MG/ML IJ SOLN
INTRAMUSCULAR | Status: DC | PRN
  Administered 2015-07-29 (×4): 80 ug via INTRAVENOUS
  Administered 2015-07-29: 40 ug via INTRAVENOUS
  Administered 2015-07-29 (×2): 80 ug via INTRAVENOUS
  Administered 2015-07-29: 40 ug via INTRAVENOUS

## 2015-07-29 MED ORDER — HYDROCHLOROTHIAZIDE 25 MG PO TABS: 25.0000 mg | ORAL_TABLET | Freq: Every day | ORAL | Status: DC

## 2015-07-29 MED ORDER — VITAMIN B-COMPLEX PO TABS: ORAL_TABLET | Freq: Every day | ORAL | Status: DC | PRN

## 2015-07-29 MED ORDER — SODIUM CHLORIDE 0.9% FLUSH: 3.0000 mL | Freq: Two times a day (BID) | INTRAVENOUS | Status: DC

## 2015-07-29 MED ORDER — ONDANSETRON HCL 4 MG/2ML IJ SOLN: 4.0000 mg | Freq: Once | INTRAMUSCULAR | Status: DC | PRN

## 2015-07-29 MED ORDER — VANCOMYCIN HCL 1000 MG IV SOLR
INTRAVENOUS | Status: AC
  Filled 2015-07-29: qty 1000

## 2015-07-29 MED ORDER — ACETAMINOPHEN 650 MG RE SUPP: 650.0000 mg | RECTAL | Status: DC | PRN

## 2015-07-29 MED ORDER — VANCOMYCIN HCL IN DEXTROSE 1-5 GM/200ML-% IV SOLN
1000.0000 mg | INTRAVENOUS | Status: AC
  Administered 2015-07-29: 1000 mg via INTRAVENOUS
  Filled 2015-07-29: qty 200

## 2015-07-29 MED ORDER — BUPIVACAINE LIPOSOME 1.3 % IJ SUSP
INTRAMUSCULAR | Status: DC | PRN
  Administered 2015-07-29: 20 mL

## 2015-07-29 MED ORDER — LIDOCAINE 2% (20 MG/ML) 5 ML SYRINGE
INTRAMUSCULAR | Status: AC
  Filled 2015-07-29: qty 5

## 2015-07-29 MED ORDER — ST JOHNS WORT 150 MG PO CAPS: ORAL_CAPSULE | Freq: Every day | ORAL | Status: DC | PRN

## 2015-07-29 MED ORDER — HYDROMORPHONE HCL 1 MG/ML IJ SOLN
0.5000 mg | INTRAMUSCULAR | Status: AC | PRN
  Administered 2015-07-29 (×4): 0.5 mg via INTRAVENOUS

## 2015-07-29 MED ORDER — FENTANYL CITRATE (PF) 250 MCG/5ML IJ SOLN
INTRAMUSCULAR | Status: DC | PRN
  Administered 2015-07-29 (×3): 50 ug via INTRAVENOUS
  Administered 2015-07-29: 25 ug via INTRAVENOUS
  Administered 2015-07-29 (×3): 50 ug via INTRAVENOUS

## 2015-07-29 MED ORDER — OXYCODONE-ACETAMINOPHEN 5-325 MG PO TABS
ORAL_TABLET | ORAL | Status: AC
  Filled 2015-07-29: qty 2

## 2015-07-29 MED ORDER — CYCLOBENZAPRINE HCL 10 MG PO TABS
ORAL_TABLET | ORAL | Status: AC
  Filled 2015-07-29: qty 1

## 2015-07-29 MED ORDER — VANCOMYCIN HCL 1000 MG IV SOLR
INTRAVENOUS | Status: DC | PRN
  Administered 2015-07-29: 1000 mg

## 2015-07-29 MED ORDER — ONDANSETRON HCL 4 MG/2ML IJ SOLN
INTRAMUSCULAR | Status: DC | PRN
  Administered 2015-07-29: 4 mg via INTRAVENOUS

## 2015-07-29 MED ORDER — TURMERIC 500 MG PO CAPS: 1.0000 | ORAL_CAPSULE | Freq: Every day | ORAL | Status: DC

## 2015-07-29 MED ORDER — ONDANSETRON HCL 4 MG/2ML IJ SOLN
4.0000 mg | INTRAMUSCULAR | Status: DC | PRN
Start: 2015-07-29 — End: 2015-07-30

## 2015-07-29 MED ORDER — SUGAMMADEX SODIUM 500 MG/5ML IV SOLN
INTRAVENOUS | Status: AC
  Filled 2015-07-29: qty 5

## 2015-07-29 MED ORDER — 0.9 % SODIUM CHLORIDE (POUR BTL) OPTIME
TOPICAL | Status: DC | PRN
  Administered 2015-07-29: 1000 mL

## 2015-07-29 MED ORDER — ROCURONIUM BROMIDE 50 MG/5ML IV SOLN
INTRAVENOUS | Status: AC
  Filled 2015-07-29: qty 1

## 2015-07-29 MED ORDER — PHENYLEPHRINE 40 MCG/ML (10ML) SYRINGE FOR IV PUSH (FOR BLOOD PRESSURE SUPPORT)
PREFILLED_SYRINGE | INTRAVENOUS | Status: AC
  Filled 2015-07-29: qty 20

## 2015-07-29 MED ORDER — ONDANSETRON HCL 4 MG/2ML IJ SOLN
INTRAMUSCULAR | Status: AC
  Filled 2015-07-29: qty 2

## 2015-07-29 MED ORDER — ROCURONIUM BROMIDE 100 MG/10ML IV SOLN
INTRAVENOUS | Status: DC | PRN
  Administered 2015-07-29: 30 mg via INTRAVENOUS
  Administered 2015-07-29 (×2): 50 mg via INTRAVENOUS

## 2015-07-29 MED ORDER — LIDOCAINE 2% (20 MG/ML) 5 ML SYRINGE
INTRAMUSCULAR | Status: AC
Start: 2015-07-29 — End: 2015-07-29
  Filled 2015-07-29: qty 5

## 2015-07-29 MED ORDER — BUPIVACAINE LIPOSOME 1.3 % IJ SUSP
20.0000 mL | Freq: Once | INTRAMUSCULAR | Status: DC
  Filled 2015-07-29: qty 20

## 2015-07-29 MED ORDER — LACTATED RINGERS IV SOLN
INTRAVENOUS | Status: DC | PRN
  Administered 2015-07-29 (×3): via INTRAVENOUS

## 2015-07-29 MED ORDER — THROMBIN 20000 UNITS EX SOLR
CUTANEOUS | Status: DC | PRN
  Administered 2015-07-29 (×2): via TOPICAL

## 2015-07-29 MED ORDER — ALBUMIN HUMAN 5 % IV SOLN
INTRAVENOUS | Status: DC | PRN
Start: 2015-07-29 — End: 2015-07-29
  Administered 2015-07-29 (×2): via INTRAVENOUS

## 2015-07-29 MED ORDER — OXYCODONE-ACETAMINOPHEN 5-325 MG PO TABS
1.0000 | ORAL_TABLET | ORAL | Status: DC | PRN
  Administered 2015-07-29: 2 via ORAL

## 2015-07-29 MED ORDER — ACETAMINOPHEN 325 MG PO TABS: 650.0000 mg | ORAL_TABLET | ORAL | Status: DC | PRN

## 2015-07-29 MED ORDER — LIDOCAINE HCL (CARDIAC) 20 MG/ML IV SOLN
INTRAVENOUS | Status: DC | PRN
  Administered 2015-07-29: 100 mg via INTRAVENOUS

## 2015-07-29 MED ORDER — PROPOFOL 10 MG/ML IV BOLUS
INTRAVENOUS | Status: DC | PRN
  Administered 2015-07-29: 130 mg via INTRAVENOUS

## 2015-07-29 SURGICAL SUPPLY — 78 items
APL SKNCLS STERI-STRIP NONHPOA (GAUZE/BANDAGES/DRESSINGS) ×1
BAG DECANTER FOR FLEXI CONT (MISCELLANEOUS) ×2 IMPLANT
BENZOIN TINCTURE PRP APPL 2/3 (GAUZE/BANDAGES/DRESSINGS) ×2 IMPLANT
BIT DRILL 5.0/4.0 (BIT) IMPLANT
BLADE CLIPPER SURG (BLADE) ×1 IMPLANT
BLADE SURG 11 STRL SS (BLADE) ×2 IMPLANT
BRUSH SCRUB EZ PLAIN DRY (MISCELLANEOUS) ×2 IMPLANT
BUR MATCHSTICK NEURO 3.0 LAGG (BURR) ×2 IMPLANT
BUR PRECISION FLUTE 6.0 (BURR) ×2 IMPLANT
CAGE RISE 11-17-15 10X22 (Cage) ×4 IMPLANT
CANISTER SUCT 3000ML PPV (MISCELLANEOUS) ×2 IMPLANT
CAP LOCKING (Cap) ×6 IMPLANT
CAP LOCKING 5.5 CREO (Cap) IMPLANT
CONT SPEC 4OZ CLIKSEAL STRL BL (MISCELLANEOUS) ×2 IMPLANT
COVER BACK TABLE 60X90IN (DRAPES) ×2 IMPLANT
DECANTER SPIKE VIAL GLASS SM (MISCELLANEOUS) ×2 IMPLANT
DRAPE C-ARM 42X72 X-RAY (DRAPES) ×2 IMPLANT
DRAPE C-ARMOR (DRAPES) ×3 IMPLANT
DRAPE LAPAROTOMY 100X72X124 (DRAPES) ×2 IMPLANT
DRAPE POUCH INSTRU U-SHP 10X18 (DRAPES) ×2 IMPLANT
DRAPE PROXIMA HALF (DRAPES) IMPLANT
DRAPE SURG 17X23 STRL (DRAPES) ×2 IMPLANT
DRILL 5.0/4.0 (BIT) ×2
DRSG OPSITE 4X5.5 SM (GAUZE/BANDAGES/DRESSINGS) ×1 IMPLANT
DRSG OPSITE POSTOP 4X6 (GAUZE/BANDAGES/DRESSINGS) ×1 IMPLANT
DURAPREP 26ML APPLICATOR (WOUND CARE) ×2 IMPLANT
ELECT BLADE 4.0 EZ CLEAN MEGAD (MISCELLANEOUS) ×2
ELECT REM PT RETURN 9FT ADLT (ELECTROSURGICAL) ×2
ELECTRODE BLDE 4.0 EZ CLN MEGD (MISCELLANEOUS) IMPLANT
ELECTRODE REM PT RTRN 9FT ADLT (ELECTROSURGICAL) ×1 IMPLANT
EVACUATOR 3/16  PVC DRAIN (DRAIN)
EVACUATOR 3/16 PVC DRAIN (DRAIN) ×1 IMPLANT
GAUZE SPONGE 4X4 12PLY STRL (GAUZE/BANDAGES/DRESSINGS) ×2 IMPLANT
GAUZE SPONGE 4X4 16PLY XRAY LF (GAUZE/BANDAGES/DRESSINGS) IMPLANT
GLOVE BIO SURGEON STRL SZ 6.5 (GLOVE) ×2 IMPLANT
GLOVE BIO SURGEON STRL SZ8 (GLOVE) ×4 IMPLANT
GLOVE BIOGEL PI IND STRL 7.0 (GLOVE) IMPLANT
GLOVE BIOGEL PI INDICATOR 7.0 (GLOVE) ×1
GLOVE EXAM NITRILE LRG STRL (GLOVE) IMPLANT
GLOVE EXAM NITRILE MD LF STRL (GLOVE) IMPLANT
GLOVE EXAM NITRILE XL STR (GLOVE) IMPLANT
GLOVE EXAM NITRILE XS STR PU (GLOVE) IMPLANT
GLOVE INDICATOR 8.5 STRL (GLOVE) ×4 IMPLANT
GOWN STRL REUS W/ TWL LRG LVL3 (GOWN DISPOSABLE) IMPLANT
GOWN STRL REUS W/ TWL XL LVL3 (GOWN DISPOSABLE) ×2 IMPLANT
GOWN STRL REUS W/TWL 2XL LVL3 (GOWN DISPOSABLE) IMPLANT
GOWN STRL REUS W/TWL LRG LVL3 (GOWN DISPOSABLE) ×4
GOWN STRL REUS W/TWL XL LVL3 (GOWN DISPOSABLE) ×4
KIT BASIN OR (CUSTOM PROCEDURE TRAY) ×2 IMPLANT
KIT ROOM TURNOVER OR (KITS) ×2 IMPLANT
LIQUID BAND (GAUZE/BANDAGES/DRESSINGS) ×2 IMPLANT
MILL MEDIUM DISP (BLADE) ×1 IMPLANT
NDL HYPO 21X1.5 SAFETY (NEEDLE) ×1 IMPLANT
NDL HYPO 25X1 1.5 SAFETY (NEEDLE) ×1 IMPLANT
NEEDLE HYPO 21X1.5 SAFETY (NEEDLE) ×2 IMPLANT
NEEDLE HYPO 25X1 1.5 SAFETY (NEEDLE) ×2 IMPLANT
NS IRRIG 1000ML POUR BTL (IV SOLUTION) ×2 IMPLANT
PACK LAMINECTOMY NEURO (CUSTOM PROCEDURE TRAY) ×2 IMPLANT
PAD ARMBOARD 7.5X6 YLW CONV (MISCELLANEOUS) ×6 IMPLANT
PATTIES SURGICAL 1X1 (DISPOSABLE) ×1 IMPLANT
PUTTY BONE DBX 5CC MIX (Putty) ×1 IMPLANT
ROD 65MM SPINAL (Rod) ×2 IMPLANT
ROD SPNL 5.5 CREO TI 65 (Rod) IMPLANT
SCREW MOD 6.0-5.0X35MM (Screw) ×2 IMPLANT
SCREW PREASSEMBLY 6.0-5.0X35 (Screw) ×4 IMPLANT
SPONGE LAP 4X18 X RAY DECT (DISPOSABLE) ×1 IMPLANT
SPONGE SURGIFOAM ABS GEL 100 (HEMOSTASIS) ×2 IMPLANT
STRIP CLOSURE SKIN 1/2X4 (GAUZE/BANDAGES/DRESSINGS) ×3 IMPLANT
SUT VIC AB 0 CT1 18XCR BRD8 (SUTURE) ×1 IMPLANT
SUT VIC AB 0 CT1 8-18 (SUTURE) ×4
SUT VIC AB 2-0 CT1 18 (SUTURE) ×3 IMPLANT
SUT VIC AB 4-0 PS2 27 (SUTURE) ×2 IMPLANT
SYR 20CC LL (SYRINGE) ×2 IMPLANT
TOWEL OR 17X24 6PK STRL BLUE (TOWEL DISPOSABLE) ×2 IMPLANT
TOWEL OR 17X26 10 PK STRL BLUE (TOWEL DISPOSABLE) ×2 IMPLANT
TRAY FOLEY W/METER SILVER 14FR (SET/KITS/TRAYS/PACK) ×2 IMPLANT
TULIP CREP AMP 5.5MM (Orthopedic Implant) ×2 IMPLANT
WATER STERILE IRR 1000ML POUR (IV SOLUTION) ×2 IMPLANT

## 2015-07-29 NOTE — Anesthesia Preprocedure Evaluation (Addendum)
Anesthesia Evaluation  Patient identified by MRN, date of birth, ID band Patient awake    Reviewed: Allergy & Precautions, NPO status , Patient's Chart, lab work & pertinent test results  Airway Mallampati: I  TM Distance: >3 FB Neck ROM: Full    Dental  (+) Teeth Intact, Dental Advisory Given   Pulmonary    Pulmonary exam normal        Cardiovascular hypertension, Normal cardiovascular exam     Neuro/Psych Anxiety    GI/Hepatic   Endo/Other    Renal/GU      Musculoskeletal  (+) Arthritis ,   Abdominal   Peds  Hematology   Anesthesia Other Findings   Reproductive/Obstetrics                            Anesthesia Physical Anesthesia Plan  ASA: II  Anesthesia Plan: General   Post-op Pain Management:    Induction: Intravenous  Airway Management Planned: Oral ETT  Additional Equipment:   Intra-op Plan:   Post-operative Plan: Extubation in OR  Informed Consent: I have reviewed the patients History and Physical, chart, labs and discussed the procedure including the risks, benefits and alternatives for the proposed anesthesia with the patient or authorized representative who has indicated his/her understanding and acceptance.     Plan Discussed with: CRNA, Anesthesiologist and Surgeon  Anesthesia Plan Comments:         Anesthesia Quick Evaluation

## 2015-07-29 NOTE — Transfer of Care (Signed)
Immediate Anesthesia Transfer of Care Note  Patient: Samantha DoveWilla S Capitano  Procedure(s) Performed: Procedure(s): POSTERIOR LUMBAR INTERBODY FUSION LUMBAR THREE-FOUR ,LUMBAR FOUR-FIVE (N/A)  Patient Location: PACU  Anesthesia Type:General  Level of Consciousness: awake and alert   Airway & Oxygen Therapy: Patient Spontanous Breathing and Patient connected to nasal cannula oxygen  Post-op Assessment: Report given to RN, Post -op Vital signs reviewed and stable and Patient moving all extremities X 4  Post vital signs: Reviewed and stable  Last Vitals:  Filed Vitals:   07/29/15 0755  BP: 121/91  Pulse: 74  Temp: 36.7 C  Resp: 20    Last Pain:  Filed Vitals:   07/29/15 1407  PainSc: 4       Patients Stated Pain Goal: 3 (07/29/15 [id number])  Complications: No apparent anesthesia complications

## 2015-07-29 NOTE — Anesthesia Postprocedure Evaluation (Signed)
Anesthesia Post Note  Patient: Sharion DoveWilla S Kassem  Procedure(s) Performed: Procedure(s) (LRB): POSTERIOR LUMBAR INTERBODY FUSION LUMBAR THREE-FOUR ,LUMBAR FOUR-FIVE (N/A)  Patient location during evaluation: PACU Anesthesia Type: General Level of consciousness: awake, oriented, sedated and patient cooperative Pain management: pain level controlled Vital Signs Assessment: post-procedure vital signs reviewed and stable Respiratory status: spontaneous breathing and respiratory function stable Cardiovascular status: stable Anesthetic complications: no    Last Vitals:  Filed Vitals:   07/29/15 1405 07/29/15 1416  BP: 117/88   Pulse: 90 85  Temp:    Resp: 20 15    Last Pain:  Filed Vitals:   07/29/15 1420  PainSc: 10-Worst pain ever                 Sharee Sturdy EDWARD

## 2015-07-29 NOTE — H&P (Signed)
Samantha Burnett is an 55 y.o. female.   Chief Complaint: Back and left leg pain HPI: Patient is a very pleasant 55 year old female whose had long-standing back and left leg pain refractory to all forms of conservative treatment workup revealed severe lumbar spondylosis stenosis and a slight listhesis with a synovial cyst causing severe thecal sac and foraminal stenosis. This is predominantly at L3-4 and L4-5. Due to patient's failure of conservative treatment imaging findings and progression of clinical syndrome I recommended decompression stabilization procedure at L3-4 and L4-5. I've extensively gone over the risks and benefits of the operation the patient as well as perioperative course expectations of outcome and alternatives of surgery and she understands and agrees to proceed forward.  Past Medical History  Diagnosis Date  . Hypertension   . Arthritis   . Anemia   . Anxiety     Past Surgical History  Procedure Laterality Date  . Myomectomy    . Tubal ligation  1989  . Eye surgery      lasick   . Colonoscopy    . Bone spurs      back    History reviewed. No pertinent family history. Social History:  reports that she has never smoked. She has never used smokeless tobacco. She reports that she does not drink alcohol or use illicit drugs.  Allergies:  Allergies  Allergen Reactions  . Penicillins Hives, Itching and Rash    Has patient had a PCN reaction causing immediate rash, facial/tongue/throat swelling, SOB or lightheadedness with hypotension: Yes Has patient had a PCN reaction causing severe rash involving mucus membranes or skin necrosis: No Has patient had a PCN reaction that required hospitalization No Has patient had a PCN reaction occurring within the last 10 years: No If all of the above answers are "NO", then may proceed with Cephalosporin use.     Medications Prior to Admission  Medication Sig Dispense Refill  . Artificial Tear Ointment (DRY EYES OP) Place 1 drop  into both eyes daily as needed (for dry eyes).    . B Complex Vitamins (VITAMIN B-COMPLEX PO) Take 1 tablet by mouth daily as needed (for energy).     . ferrous sulfate 325 (65 FE) MG tablet Take 325 mg by mouth daily as needed (for iron supplementation).     . hydrochlorothiazide (HYDRODIURIL) 25 MG tablet Take 25 mg by mouth daily.  0  . HYDROcodone-acetaminophen (NORCO/VICODIN) 5-325 MG per tablet Take 1 tablet by mouth 4 (four) times daily as needed. For pain    . Multiple Vitamins-Minerals (MULTIVITAMIN WITH MINERALS) tablet Take 1 tablet by mouth daily.    . ST JOHNS WORT PO Take 1 tablet by mouth daily as needed (for calmness).     . Turmeric (CURCUMIN 95) 500 MG CAPS Take 1 tablet by mouth daily.      No results found for this or any previous visit (from the past 48 hour(s)). No results found.  Review of Systems  Constitutional: Negative.   HENT: Negative.   Eyes: Negative.   Respiratory: Negative.   Cardiovascular: Negative.   Gastrointestinal: Negative.   Genitourinary: Negative.   Musculoskeletal: Positive for myalgias and back pain.  Skin: Negative.   Neurological: Positive for tingling and sensory change.  Psychiatric/Behavioral: Negative.     Blood pressure 121/91, pulse 74, temperature 98.1 F (36.7 C), temperature source Oral, resp. rate 20, height 5' (1.524 m), weight 87.544 kg (193 lb), SpO2 98 %. Physical Exam  Constitutional: She is oriented  to person, place, and time. She appears well-developed and well-nourished.  Eyes: Pupils are equal, round, and reactive to light.  Neck: Normal range of motion.  Respiratory: Effort normal.  GI: Soft. Bowel sounds are normal.  Musculoskeletal: Normal range of motion.  Neurological: She is alert and oriented to person, place, and time. She has normal strength. GCS eye subscore is 4. GCS verbal subscore is 5. GCS motor subscore is 6.  Strength is 5 out of 5 in her iliopsoas, quads, hip she's, gastric, into tibialis, EHL.   Skin: Skin is warm and dry.     Assessment/Plan 55 year old female presents for an L3-4 L4-5 decompression stabilization procedure.  Meggen Spaziani P, MD 07/29/2015, 9:26 AM

## 2015-07-29 NOTE — Progress Notes (Signed)
PHARMACIST - PHYSICIAN ORDER COMMUNICATION  CONCERNING: P&T Medication Policy on Herbal Medications  DESCRIPTION:  This patient's order for:  Tumeric and St. John's Wort has been noted.  This product(s) is classified as an "herbal" or natural product. Due to a lack of definitive safety studies or FDA approval, nonstandard manufacturing practices, plus the potential risk of unknown drug-drug interactions while on inpatient medications, the Pharmacy and Therapeutics Committee does not permit the use of "herbal" or natural products of this type within [facility].   ACTION TAKEN: The pharmacy department is unable to verify this order at this time.  Please reevaluate patient's clinical condition at discharge and address if the herbal or natural product(s) should be resumed at that time.  Arcola JanskyMeagan Raiyah Speakman, PharmD Clinical Pharmacy Resident Pager: [phone]

## 2015-07-29 NOTE — Op Note (Signed)
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  #2 posterior lumbar interbody fusion L3-4 L4-5 utilizing the globus rise expandable cage system packed with locally harvested autograft mixed with DBX mix  #3 cortical screw fixation L3-L5 using the globus Creo modular cortical screw set with 60/50 by 35 mm screws at L3, L4, and L5 bilaterally  #4 open reduction spinal deformity  #5 placement of large Hemovac drain  Surgeon: Jillyn Hidden Anahi Belmar  Assistant: Sharlet Salina ditty  Anesthesia: Gen.  EBL: 600 with 2:30 of Cell Saver given back  History of present illness: Patient is a very pleasant 55 year old female is a back and left greater than right leg pain that's compressed dorsal last weeks and months workup revealed a dynamic spondylolisthesis at L3-4 and L4-5 with severe spinal stenosis and a synovial cyst at L4-5. Due to patient's failure conservative treatment imaging findings and progressive clinical syndrome I recommended decompression stabilization procedures at L3-4 and L4-5. I extensively reviewed the risks and benefits of the operation with the patient as well as perioperative course expectations of outcome and alternatives of surgery and she understood and agreed to proceed forward.  Operative procedure: Patient brought into the or was induced on general anesthesia positioned prone the Wilson frame her back was prepped and draped in routine sterile fashion preoperative localizing appropriate level so on midline incision was made and Bovie cautery was used to 10 subcutaneous tissues and subperiosteal dissection carried lamina of L3, L4, and L5  bilaterally th lateral pars was dissected free and utilizing fluoroscopy in AP and lateral trajectory appropriate level was identified and 2 pilot holes were drilled of the 5 and 7, position of the L3 pedicle . Then utilizing fluoroscopy pilot holes were drilled probed tapped with a 45 tap probed and a 60/50 35 mm cortical screws placed bilaterally at L3. Fluoroscopy confirmed good position of this implant. At this point attention taken the decompression spinous processes at L3 and L4 removed completely then this complete central decompression was begun complete medial facetectomies were performed bilaterally was marked thecal sac compression to hourglass hypertrophy of the facet complexes after dissecting through the scar tissue and removing those the foramina were opened up I removed a synovial cyst on the left L4-5 which was densely adherent to the dura completely unroofed the foramina at L3-L4 and L5 decompressing all those nerve roots. Then working in the disc space disc space was prepared bilaterally extensive disc and central disc was removed all endplates (with an 11 distractor in Place 2 expandable cages 02/06/2014 degree 20 mm in length were inserted then after adequate endplate preparation was carried on the opposite side local autograft mixed with DBX was packed centrally contralateral cage was inserted all 4 cages were expanded 3-1/2 turns. This significantly reduce the deformity and the listhesis. Then cortical screws were placed in similar fashion at L4 and L5 all screws excellent purchase. Heads were then reassembled the wound scope was irrigated fixing space was maintained extra was injected in the muscle fascia thank powder was sprinkled all the foraminal reinspected confirm migration of graft material Gelfoam was overlaid top of the dura 65 mm rods placed and all anchored in place and locked in place. The  muscle fascia pressure layers after Vicryl the more vancomycin powder was speckled in the  extrafascial space and the skin was ultimately closed with a running 4 subcuticular Dermabond benzo and Steri-Strips and sterile dressing were applied and patient recovered in stable condition. At the end the case all needle counts sponge counts were correct.

## 2015-07-29 NOTE — Anesthesia Procedure Notes (Signed)
Procedure Name: Intubation Date/Time: 07/29/2015 9:38 AM Performed by: Ollen Bowl Pre-anesthesia Checklist: Patient identified, Emergency Drugs available, Suction available, Patient being monitored and Timeout performed Patient Re-evaluated:Patient Re-evaluated prior to inductionOxygen Delivery Method: Circle system utilized and Simple face mask Preoxygenation: Pre-oxygenation with 100% oxygen Intubation Type: IV induction Ventilation: Mask ventilation without difficulty and Oral airway inserted - appropriate to patient size Laryngoscope Size: Sabra Heck and 2 Grade View: Grade II Tube type: Oral Tube size: 7.5 mm Number of attempts: 1 Airway Equipment and Method: Patient positioned with wedge pillow,  Stylet and LTA kit utilized Placement Confirmation: ETT inserted through vocal cords under direct vision,  positive ETCO2 and breath sounds checked- equal and bilateral Secured at: 21 cm Tube secured with: Tape Dental Injury: Teeth and Oropharynx as per pre-operative assessment

## 2015-07-30 MED ORDER — CYCLOBENZAPRINE HCL 10 MG PO TABS: 10.0000 mg | ORAL_TABLET | Freq: Three times a day (TID) | ORAL | Status: DC | PRN

## 2015-07-30 MED ORDER — OXYCODONE HCL 15 MG PO TABS: 10.0000 mg | ORAL_TABLET | ORAL | Status: DC | PRN

## 2015-07-30 MED FILL — Sodium Chloride IV Soln 0.9%: INTRAVENOUS | Qty: 1000 | Status: AC

## 2015-07-30 NOTE — Discharge Summary (Signed)
  Physician Discharge Summary  Patient ID: Samantha Burnett MRN: [id number] DOB/AGE: 55/08/1960 55 y.o.  Admit date: 07/29/2015 Discharge date: 07/30/2015  Admission DiagSharion Dovenoses:Grade 1 spondylolisthesis L3-4 L4-5  Discharge Diagnoses: Same Active Problems:   Spondylolisthesis at L3-L4 level   Discharged Condition: good  Hospital Course: Patient was admitted hospital underwent decompressive laminectomy and fusion L3-4 L4-5. Postoperative patient was doing fairly well was ambulating and voiding patient will be seen by physical therapy and should the patient feel like her pain is well controlled and she is cleared by physical therapy she can be discharged later today. Patient be discharged on oxycodone and cyclobenzaprine for pain and muscle relaxation.  Consults: Significant Diagnostic Studies: Treatments: L3-4 L4-5 PLIF Discharge Exam: Blood pressure 100/58, pulse 95, temperature 98 F (36.7 C), temperature source Oral, resp. rate 20, height 5' (1.524 m), weight 87.544 kg (193 lb), SpO2 97 %. Strength out of 5 wound clean dry and intact  Disposition: Home     Medication List    TAKE these medications        CURCUMIN 95 500 MG Caps  Generic drug:  Turmeric  Take 1 tablet by mouth daily.     cyclobenzaprine 10 MG tablet  Commonly known as:  FLEXERIL  Take 1 tablet (10 mg total) by mouth 3 (three) times daily as needed for muscle spasms.     DRY EYES OP  Place 1 drop into both eyes daily as needed (for dry eyes).     ferrous sulfate 325 (65 FE) MG tablet  Take 325 mg by mouth daily as needed (for iron supplementation).     hydrochlorothiazide 25 MG tablet  Commonly known as:  HYDRODIURIL  Take 25 mg by mouth daily.     HYDROcodone-acetaminophen 5-325 MG tablet  Commonly known as:  NORCO/VICODIN  Take 1 tablet by mouth 4 (four) times daily as needed. For pain     multivitamin with minerals tablet  Take 1 tablet by mouth daily.     oxyCODONE 15 MG immediate release tablet   Commonly known as:  ROXICODONE  Take 0.5 tablets (7.5 mg total) by mouth every 4 (four) hours as needed for moderate pain or severe pain.     ST JOHNS WORT PO  Take 1 tablet by mouth daily as needed (for calmness).     VITAMIN B-COMPLEX PO  Take 1 tablet by mouth daily as needed (for energy).           Follow-up Information    [facility]Gerhart Ruggieri P, MD.   Specialty:  Neurosurgery   Contact information:   [address] [place] [address] [phone]       Signed: Mariam DollarCRAM,Durell Lofaso P 07/30/2015, 7:39 AM

## 2015-07-30 NOTE — Progress Notes (Signed)
Patient ID: Samantha DoveWilla S Kawahara, female   DOB: 02/02/1961, 55 y.o.   MRN: [id number] Well no leg pain  Strength 5 out of 5 wound clean dry and intact  Mobilized today with physical therapy patient can be discharged later today if she feels up to it pain controlled on pills and cleared by physical therapy

## 2015-07-30 NOTE — Evaluation (Signed)
Physical Therapy Evaluation Patient Details Name: Samantha Burnett MRN: [id number] DOB: 1960/08/02 Today's Date: 07/30/2015   History of Present Illness  55 yo s/p L3-5 PLIF PMH: bone spurs  Clinical Impression  Patient evaluated by Physical Therapy with no further acute PT needs identified. All education has been completed and the patient has no further questions. At the time of PT eval pt was able to perform transfers and ambulation with supervision to modified independence. Pt was instructed in walking program and general safety for home mobility. See below for any follow-up Physical Therapy or equipment needs. PT is signing off. Thank you for this referral.     Follow Up Recommendations Outpatient PT;Supervision for mobility/OOB    Equipment Recommendations  None recommended by PT    Recommendations for Other Services       Precautions / Restrictions Precautions Precautions: Back Precaution Comments: handout provided and reviewed in detail. Pt was cued for precautions during functional mobility.  Required Braces or Orthoses: Spinal Brace Spinal Brace: Lumbar corset;Applied in sitting position Restrictions Weight Bearing Restrictions: No      Mobility  Bed Mobility Overal bed mobility: Modified Independent             General bed mobility comments: Pt was able to transition to EOB without rails and HOB lowered to simulate home environment.  Transfers Overall transfer level: Needs assistance Equipment used: None Transfers: Sit to/from Stand Sit to Stand: Supervision         General transfer comment: requires use of hands from lower surface  Ambulation/Gait Ambulation/Gait assistance: Supervision Ambulation Distance (Feet): 400 Feet Assistive device: None Gait Pattern/deviations: Step-through pattern;Decreased stride length;Trunk flexed Gait velocity: Decreased Gait velocity interpretation: Below normal speed for age/gender General Gait Details: Pt was able to  ambulate well in hall with no LOB and no unsteadiness noted. Noted slow, guarded steps.   Stairs Stairs: Yes Stairs assistance: Supervision Stair Management: One rail Right;Step to pattern;Forwards Number of Stairs: 3 General stair comments: VC's for sequencing and safety.   Wheelchair Mobility    Modified Rankin (Stroke Patients Only)       Balance Overall balance assessment: No apparent balance deficits (not formally assessed)                                           Pertinent Vitals/Pain Pain Assessment: Faces Faces Pain Scale: Hurts a little bit Pain Location: Incision Pain Descriptors / Indicators: Operative site guarding Pain Intervention(s): Limited activity within patient's tolerance;Monitored during session;Repositioned    Home Living Family/patient expects to be discharged to:: Private residence Living Arrangements: Alone Available Help at Discharge: Family Type of Home: House Home Access: Stairs to enter Entrance Stairs-Rails: Right;Left;Can reach both Secretary/administrator of Steps: 3 Home Layout: One level Home Equipment: None      Prior Function Level of Independence: Independent               Hand Dominance   Dominant Hand: Right    Extremity/Trunk Assessment   Upper Extremity Assessment: Defer to OT evaluation           Lower Extremity Assessment: Overall WFL for tasks assessed      Cervical / Trunk Assessment: Other exceptions (Lumbar incision site)  Communication   Communication: No difficulties  Cognition Arousal/Alertness: Awake/alert Behavior During Therapy: WFL for tasks assessed/performed Overall Cognitive Status: Within Functional Limits for tasks  assessed                      General Comments      Exercises        Assessment/Plan    PT Assessment Patent does not need any further PT services  PT Diagnosis Difficulty walking   PT Problem List    PT Treatment Interventions     PT  Goals (Current goals can be found in the Care Plan section) Acute Rehab PT Goals PT Goal Formulation: All assessment and education complete, DC therapy    Frequency     Barriers to discharge        Co-evaluation               End of Session Equipment Utilized During Treatment: Back brace Activity Tolerance: Patient tolerated treatment well Patient left: in chair;with call bell/phone within reach;with family/visitor present Nurse Communication: Mobility status         Time: [id number] PT Time Calculation (min) (ACUTE ONLY): 25 min   Charges:   PT Evaluation $PT Eval Moderate Complexity: 1 Procedure PT Treatments $Gait Training: 8-22 mins   PT G Codes:        Conni SlipperKirkman, Garyson Stelly 07/30/2015, 12:13 PM   Conni SlipperLaura Milam Allbaugh, PT, DPT Acute Rehabilitation Services Pager: [phone]

## 2015-07-30 NOTE — Discharge Instructions (Signed)
No lifting no bending no twisting no driving no riding a car unless she is calm back and forth to see me. Keep incision clean dry and intact. May remove the outer dressing in 3-4 days leave the Steri-Strips on and intact. Cover the Steri-Strips with saran wrap for showers only.  Wound Care Keep incision covered and dry for one week.  If you shower prior to then, cover incision with plastic wrap.  You may remove outer bandage after one week and shower.  Do not put any creams, lotions, or ointments on incision. Leave steri-strips on neck.  They will fall off by themselves. Activity Walk each and every day, increasing distance each day. No lifting greater than 5 lbs.  Avoid bending, arching, or twisting. No driving for 2 weeks; may ride as a passenger locally. If provided with back brace, wear when out of bed.  It is not necessary to wear in bed. Diet Resume your normal diet.  Return to Work Will be discussed at you follow up appointment. Call Your Doctor If Any of These Occur Redness, drainage, or swelling at the wound.  Temperature greater than 101 degrees. Severe pain not relieved by pain medication. Incision starts to come apart. Follow Up Appt Call today for appointment in 1-2 weeks ([phone]) or for problems.  If you have any hardware placed in your spine, you will need an x-ray before your appointment.  Spinal Fusion Spinal fusion is a procedure to make 2 or more of the bones in your spinal column (vertebrae) grow together (fuse). This procedure stops movement between the vertebrae and can relieve pain and prevent deformity.  Spinal fusion is used to treat the following conditions:  Fractures of the spine.  Herniated disk (the spongy material [cartilage] between the vertebrae).  Abnormal curvatures of the spine, such as scoliosis or kyphosis.  A weak or an unstable spine, caused by infections or tumor. RISKS AND COMPLICATIONS Complications associated with spinal fusion are  rare, but they can occur. Possible complications include:  Bleeding.  Infection near the incision.  Nerve damage. Signs of nerve damage are back pain, pain in one or both legs, weakness, or numbness.  Spinal fluid leakage.  Blood clot in your leg, which can move to your lungs.  Difficulty controlling urination or bowel movements. BEFORE THE PROCEDURE  A medical evaluation will be done. This will include a physical exam, blood tests, and imaging exams.  You will talk with an anesthesiologist. This is the person who will be in charge of the anesthesia during the procedure. Spinal fusion usually requires that you are asleep during the procedure (general anesthesia).  You will need to stop taking certain medicines, particularly those associated with an increased risk of bleeding. Ask your caregiver about changing or stopping your regular medicines.  If you smoke, you will need to stop at least 2 weeks before the procedure. Smoking can slow down the healing process, especially fusion of the vertebrae, and increase the risk of complications.  Do not eat or drink anything for at least 8 hours before the procedure. PROCEDURE  A cut (incision) is made over the vertebrae that will be fused. The back muscles are separated from the vertebrae. If you are having this procedure to treat a herniated disk, the disc material pressing on the nerve root is removed (decompression). The area where the disk is removed is then filled with extra bone. Bone from another part of your body (autogenous bone) or bone from a bone donor (allograft  bone) may be used. The extra bone promotes fusion between the vertebrae. Sometimes, specific medicines are added to the fusion area to promote bone healing. In most cases, screws and rods or metal plates will be used to attach the vertebrae to stabilize them while they fuse.  AFTER THE PROCEDURE   You will stay in a recovery area until the anesthesia has worn off. Your blood  pressure and pulse will be checked frequently.  You will be given antibiotics to prevent infection.  You may continue to receive fluids through an intravenous (IV) tube while you are still in the hospital.  Pain after surgery is normal. You will be given pain medicine.  You will be taught how to move correctly and how to stand and walk. While in bed, you will be instructed to turn frequently, using a "log rolling" technique, in which the entire body is moved without twisting the back.   This information is not intended to replace advice given to you by your health care provider. Make sure you discuss any questions you have with your health care provider.   Document Released: 12/06/2002 Document Revised: 06/01/2011 Document Reviewed: 08/22/2014 Elsevier Interactive Patient Education [employer]

## 2015-07-30 NOTE — Progress Notes (Signed)
Pt and family given D/C instructions with Rx's, verbal understanding was provided. Pt's IV and Hemovac were removed prior to D/C. Pt's incision is clean and dry with no sign of infection. Pt received 3-n-1 from [facility] prior to D/C. Pt D/C'd home via wheelchair @ 1135 per MD order. Pt is stable @ D/C and has no other needs at this time. Rema FendtAshley Louise Victory, RN

## 2015-07-30 NOTE — Evaluation (Signed)
Occupational Therapy Evaluation Patient Details Name: Samantha Burnett MRN: [id number] DOB: 06/27/1960 Today's Date: 07/30/2015    History of Present Illness 55 yo s/p L3-5 PLIF PMH: bone spurs   Clinical Impression   Patient evaluated by Occupational Therapy with no further acute OT needs identified. All education has been completed and the patient has no further questions. See below for any follow-up Occupational Therapy or equipment needs. OT to sign off. Thank you for referral.      Follow Up Recommendations  No OT follow up    Equipment Recommendations  3 in 1 bedside comode (per patients request)    Recommendations for Other Services       Precautions / Restrictions Precautions Precautions: Back Precaution Comments: handout provided and reviewed in detail for adls with precautiosn Required Braces or Orthoses: Spinal Brace Spinal Brace: Lumbar corset;Applied in sitting position Restrictions Weight Bearing Restrictions: No      Mobility Bed Mobility Overal bed mobility: Modified Independent             General bed mobility comments: sit <>supine  Transfers Overall transfer level: Needs assistance   Transfers: Sit to/from Stand Sit to Stand: Supervision         General transfer comment: requires use of hands from lower surface    Balance                                            ADL Overall ADL's : Needs assistance/impaired Eating/Feeding: Independent   Grooming: Wash/dry face;Wash/dry hands;Supervision/safety;Standing Grooming Details (indicate cue type and reason): educated on use of cups for oral care         Upper Body Dressing : Modified independent Upper Body Dressing Details (indicate cue type and reason): able to don brace in sitting and doff  Lower Body Dressing: Supervision/safety Lower Body Dressing Details (indicate cue type and reason): able to cross bil LE and educated on dressing L LE first due to decr  reach Toilet Transfer: Sales promotion account executiveupervision/safety;BSC Toilet Transfer Details (indicate cue type and reason): pt requesting 3n1 for guest bathroom due to height commode. Daughter present and states "your bathroom is higher but not the other bathroom"     Tub/ Shower Transfer: Walk-in shower     General ADL Comments: all adl education reviewed in detail and lifting restrictions.      Vision Vision Assessment?: No apparent visual deficits   Perception     Praxis      Pertinent Vitals/Pain Pain Assessment: Faces Faces Pain Scale: Hurts a little bit Pain Location: operative Pain Descriptors / Indicators: Operative site guarding Pain Intervention(s): Monitored during session;Repositioned;Premedicated before session     Hand Dominance Right   Extremity/Trunk Assessment Upper Extremity Assessment Upper Extremity Assessment: Overall WFL for tasks assessed   Lower Extremity Assessment Lower Extremity Assessment: Defer to PT evaluation   Cervical / Trunk Assessment Cervical / Trunk Assessment: Other exceptions (s/p surg)   Communication Communication Communication: No difficulties   Cognition Arousal/Alertness: Awake/alert Behavior During Therapy: WFL for tasks assessed/performed Overall Cognitive Status: Within Functional Limits for tasks assessed                     General Comments       Exercises       Shoulder Instructions      Home Living Family/patient expects to be discharged to:: Private residence  Living Arrangements: Alone Available Help at Discharge: Family   Home Access: Stairs to enter           Bathroom Shower/Tub: Producer, television/film/video: Standard     Home Equipment: None          Prior Functioning/Environment Level of Independence: Independent             OT Diagnosis:     OT Problem List:     OT Treatment/Interventions:      OT Goals(Current goals can be found in the care plan section)    OT Frequency:      Barriers to D/C:            Co-evaluation              End of Session Equipment Utilized During Treatment: Back brace Nurse Communication: Precautions  Activity Tolerance: Patient tolerated treatment well Patient left: in bed;with call bell/phone within reach;with family/visitor present   Time: 1610-9604 OT Time Calculation (min): 30 min Charges:  OT General Charges $OT Visit: 1 Procedure OT Evaluation $OT Eval Moderate Complexity: 1 Procedure OT Treatments $Self Care/Home Management : 8-22 mins G-Codes:    Samantha Burnett 05-Aug-2015, 9:36 AM   Samantha Burnett   OTR/L Pager: [phone] Office: [phone] .

## 2015-08-26 ENCOUNTER — Other Ambulatory Visit (HOSPITAL_COMMUNITY): Payer: Self-pay | Admitting: Neurosurgery

## 2015-08-26 DIAGNOSIS — R600 Localized edema: Secondary | ICD-10-CM

## 2015-08-27 ENCOUNTER — Ambulatory Visit (HOSPITAL_COMMUNITY)
Admission: RE | Admit: 2015-08-27 | Discharge: 2015-08-27 | Disposition: A | Discharge status: Home / Self Care | Payer: Managed Care, Other (non HMO) | Source: Ambulatory Visit | Attending: Vascular Surgery | Admitting: Vascular Surgery

## 2015-08-27 DIAGNOSIS — F419 Anxiety disorder, unspecified: Secondary | ICD-10-CM | POA: Diagnosis not present

## 2015-08-27 DIAGNOSIS — I10 Essential (primary) hypertension: Secondary | ICD-10-CM | POA: Insufficient documentation

## 2015-08-27 DIAGNOSIS — R600 Localized edema: Secondary | ICD-10-CM

## 2015-10-24 ENCOUNTER — Other Ambulatory Visit: Payer: Self-pay | Admitting: Neurosurgery

## 2015-10-24 DIAGNOSIS — M5137 Other intervertebral disc degeneration, lumbosacral region: Secondary | ICD-10-CM

## 2015-10-29 ENCOUNTER — Ambulatory Visit
Admission: RE | Admit: 2015-10-29 | Discharge: 2015-10-29 | Disposition: A | Discharge status: Home / Self Care | Payer: Managed Care, Other (non HMO) | Source: Ambulatory Visit | Attending: Neurosurgery | Admitting: Neurosurgery

## 2015-10-29 ENCOUNTER — Encounter: Payer: Self-pay | Admitting: Radiology

## 2015-10-29 DIAGNOSIS — M5137 Other intervertebral disc degeneration, lumbosacral region: Secondary | ICD-10-CM

## 2016-10-20 ENCOUNTER — Other Ambulatory Visit: Payer: Self-pay | Admitting: Obstetrics and Gynecology

## 2016-12-17 ENCOUNTER — Other Ambulatory Visit: Payer: Self-pay | Admitting: Obstetrics and Gynecology

## 2017-02-04 DIAGNOSIS — G514 Facial myokymia: Secondary | ICD-10-CM | POA: Diagnosis not present

## 2017-02-09 DIAGNOSIS — M545 Low back pain: Secondary | ICD-10-CM | POA: Diagnosis not present

## 2017-02-09 DIAGNOSIS — Z0000 Encounter for general adult medical examination without abnormal findings: Secondary | ICD-10-CM | POA: Diagnosis not present

## 2017-02-09 DIAGNOSIS — I10 Essential (primary) hypertension: Secondary | ICD-10-CM | POA: Diagnosis not present

## 2017-02-09 DIAGNOSIS — Z13220 Encounter for screening for lipoid disorders: Secondary | ICD-10-CM | POA: Diagnosis not present

## 2017-02-09 DIAGNOSIS — E663 Overweight: Secondary | ICD-10-CM | POA: Diagnosis not present

## 2017-02-09 DIAGNOSIS — R600 Localized edema: Secondary | ICD-10-CM | POA: Diagnosis not present

## 2017-08-23 DIAGNOSIS — R600 Localized edema: Secondary | ICD-10-CM | POA: Diagnosis not present

## 2017-09-02 ENCOUNTER — Other Ambulatory Visit (HOSPITAL_COMMUNITY): Payer: Self-pay | Admitting: Internal Medicine

## 2017-09-02 ENCOUNTER — Ambulatory Visit (HOSPITAL_COMMUNITY)
Admission: RE | Admit: 2017-09-02 | Discharge: 2017-09-02 | Disposition: A | Discharge status: Home / Self Care | Payer: 59 | Source: Ambulatory Visit | Attending: Internal Medicine | Admitting: Internal Medicine

## 2017-09-02 DIAGNOSIS — M7989 Other specified soft tissue disorders: Principal | ICD-10-CM

## 2017-09-02 DIAGNOSIS — M79605 Pain in left leg: Secondary | ICD-10-CM

## 2017-09-02 NOTE — Progress Notes (Signed)
Left lower extremity venous duplex has been completed. Negative for DVT. A cystic structure measuring 1.2 cm high by 3.5 cm wide by greater than 4.5 cm long is found in the popliteal fossa.  Results were given to [place] at Dr. Idelle CrouchPolite's office.  09/02/17 4:22 PM Olen CordialGreg Kaely Hollan RVT

## 2017-09-20 DIAGNOSIS — M25572 Pain in left ankle and joints of left foot: Secondary | ICD-10-CM | POA: Diagnosis not present

## 2017-09-20 DIAGNOSIS — M25562 Pain in left knee: Secondary | ICD-10-CM | POA: Diagnosis not present

## 2017-10-05 DIAGNOSIS — I10 Essential (primary) hypertension: Secondary | ICD-10-CM | POA: Diagnosis not present

## 2017-10-05 DIAGNOSIS — Z6839 Body mass index (BMI) 39.0-39.9, adult: Secondary | ICD-10-CM | POA: Diagnosis not present

## 2017-10-05 DIAGNOSIS — M5440 Lumbago with sciatica, unspecified side: Secondary | ICD-10-CM | POA: Diagnosis not present

## 2017-12-17 DIAGNOSIS — N909 Noninflammatory disorder of vulva and perineum, unspecified: Secondary | ICD-10-CM | POA: Diagnosis not present

## 2017-12-17 DIAGNOSIS — Z124 Encounter for screening for malignant neoplasm of cervix: Secondary | ICD-10-CM | POA: Diagnosis not present

## 2017-12-17 DIAGNOSIS — Z6839 Body mass index (BMI) 39.0-39.9, adult: Secondary | ICD-10-CM | POA: Diagnosis not present

## 2017-12-17 DIAGNOSIS — Z01411 Encounter for gynecological examination (general) (routine) with abnormal findings: Secondary | ICD-10-CM | POA: Diagnosis not present

## 2017-12-29 DIAGNOSIS — R69 Illness, unspecified: Secondary | ICD-10-CM | POA: Diagnosis not present

## 2017-12-29 DIAGNOSIS — Z113 Encounter for screening for infections with a predominantly sexual mode of transmission: Secondary | ICD-10-CM | POA: Diagnosis not present

## 2017-12-29 DIAGNOSIS — B009 Herpesviral infection, unspecified: Secondary | ICD-10-CM | POA: Diagnosis not present

## 2018-02-11 DIAGNOSIS — E663 Overweight: Secondary | ICD-10-CM | POA: Diagnosis not present

## 2018-02-11 DIAGNOSIS — I10 Essential (primary) hypertension: Secondary | ICD-10-CM | POA: Diagnosis not present

## 2018-02-11 DIAGNOSIS — Z0000 Encounter for general adult medical examination without abnormal findings: Secondary | ICD-10-CM | POA: Diagnosis not present

## 2018-02-12 DIAGNOSIS — Z1231 Encounter for screening mammogram for malignant neoplasm of breast: Secondary | ICD-10-CM | POA: Diagnosis not present

## 2018-07-26 DIAGNOSIS — I10 Essential (primary) hypertension: Secondary | ICD-10-CM | POA: Diagnosis not present

## 2018-07-26 DIAGNOSIS — M5440 Lumbago with sciatica, unspecified side: Secondary | ICD-10-CM | POA: Diagnosis not present

## 2018-07-26 DIAGNOSIS — Z6841 Body Mass Index (BMI) 40.0 and over, adult: Secondary | ICD-10-CM | POA: Diagnosis not present

## 2018-10-20 DIAGNOSIS — Z6841 Body Mass Index (BMI) 40.0 and over, adult: Secondary | ICD-10-CM | POA: Diagnosis not present

## 2018-10-20 DIAGNOSIS — M5440 Lumbago with sciatica, unspecified side: Secondary | ICD-10-CM | POA: Diagnosis not present

## 2018-10-20 DIAGNOSIS — I10 Essential (primary) hypertension: Secondary | ICD-10-CM | POA: Diagnosis not present

## 2018-10-25 DIAGNOSIS — M5440 Lumbago with sciatica, unspecified side: Secondary | ICD-10-CM | POA: Diagnosis not present

## 2018-11-01 DIAGNOSIS — M5440 Lumbago with sciatica, unspecified side: Secondary | ICD-10-CM | POA: Diagnosis not present

## 2018-11-01 DIAGNOSIS — Z981 Arthrodesis status: Secondary | ICD-10-CM | POA: Diagnosis not present

## 2018-11-01 DIAGNOSIS — M5116 Intervertebral disc disorders with radiculopathy, lumbar region: Secondary | ICD-10-CM | POA: Diagnosis not present

## 2018-11-01 DIAGNOSIS — M5117 Intervertebral disc disorders with radiculopathy, lumbosacral region: Secondary | ICD-10-CM | POA: Diagnosis not present

## 2018-11-01 DIAGNOSIS — M48061 Spinal stenosis, lumbar region without neurogenic claudication: Secondary | ICD-10-CM | POA: Diagnosis not present

## 2018-11-03 DIAGNOSIS — M5440 Lumbago with sciatica, unspecified side: Secondary | ICD-10-CM | POA: Diagnosis not present

## 2018-11-03 DIAGNOSIS — I10 Essential (primary) hypertension: Secondary | ICD-10-CM | POA: Diagnosis not present

## 2018-11-03 DIAGNOSIS — Z6841 Body Mass Index (BMI) 40.0 and over, adult: Secondary | ICD-10-CM | POA: Diagnosis not present

## 2018-11-04 DIAGNOSIS — S46812A Strain of other muscles, fascia and tendons at shoulder and upper arm level, left arm, initial encounter: Secondary | ICD-10-CM | POA: Diagnosis not present

## 2018-11-04 DIAGNOSIS — Z041 Encounter for examination and observation following transport accident: Secondary | ICD-10-CM | POA: Diagnosis not present

## 2018-11-09 ENCOUNTER — Emergency Department (HOSPITAL_BASED_OUTPATIENT_CLINIC_OR_DEPARTMENT_OTHER): Payer: 59

## 2018-11-09 ENCOUNTER — Encounter (HOSPITAL_BASED_OUTPATIENT_CLINIC_OR_DEPARTMENT_OTHER): Payer: Self-pay | Admitting: Emergency Medicine

## 2018-11-09 ENCOUNTER — Emergency Department (HOSPITAL_BASED_OUTPATIENT_CLINIC_OR_DEPARTMENT_OTHER)
Admission: EM | Admit: 2018-11-09 | Discharge: 2018-11-09 | Disposition: A | Discharge status: Home / Self Care | Payer: 59 | Attending: Emergency Medicine | Admitting: Emergency Medicine

## 2018-11-09 ENCOUNTER — Other Ambulatory Visit: Payer: Self-pay

## 2018-11-09 DIAGNOSIS — I10 Essential (primary) hypertension: Secondary | ICD-10-CM | POA: Diagnosis not present

## 2018-11-09 DIAGNOSIS — Y999 Unspecified external cause status: Secondary | ICD-10-CM | POA: Insufficient documentation

## 2018-11-09 DIAGNOSIS — S161XXA Strain of muscle, fascia and tendon at neck level, initial encounter: Secondary | ICD-10-CM

## 2018-11-09 DIAGNOSIS — Z79899 Other long term (current) drug therapy: Secondary | ICD-10-CM | POA: Diagnosis not present

## 2018-11-09 DIAGNOSIS — M542 Cervicalgia: Secondary | ICD-10-CM | POA: Diagnosis not present

## 2018-11-09 DIAGNOSIS — Y92410 Unspecified street and highway as the place of occurrence of the external cause: Secondary | ICD-10-CM | POA: Insufficient documentation

## 2018-11-09 DIAGNOSIS — Y93I9 Activity, other involving external motion: Secondary | ICD-10-CM | POA: Diagnosis not present

## 2018-11-09 DIAGNOSIS — S199XXA Unspecified injury of neck, initial encounter: Secondary | ICD-10-CM | POA: Diagnosis present

## 2018-11-09 MED ORDER — HYDROCODONE-ACETAMINOPHEN 5-325 MG PO TABS
1.0000 | ORAL_TABLET | Freq: Four times a day (QID) | ORAL | 0 refills | Status: DC | PRN
Start: 2018-11-09 — End: 2020-12-02
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
## 2018-11-25 DIAGNOSIS — M5416 Radiculopathy, lumbar region: Secondary | ICD-10-CM | POA: Diagnosis not present

## 2018-12-08 DIAGNOSIS — Z6841 Body Mass Index (BMI) 40.0 and over, adult: Secondary | ICD-10-CM | POA: Diagnosis not present

## 2018-12-08 DIAGNOSIS — I10 Essential (primary) hypertension: Secondary | ICD-10-CM | POA: Diagnosis not present

## 2018-12-08 DIAGNOSIS — M5440 Lumbago with sciatica, unspecified side: Secondary | ICD-10-CM | POA: Diagnosis not present

## 2019-01-25 DIAGNOSIS — M549 Dorsalgia, unspecified: Secondary | ICD-10-CM | POA: Diagnosis not present

## 2019-01-31 DIAGNOSIS — Z6838 Body mass index (BMI) 38.0-38.9, adult: Secondary | ICD-10-CM | POA: Diagnosis not present

## 2019-01-31 DIAGNOSIS — R69 Illness, unspecified: Secondary | ICD-10-CM | POA: Diagnosis not present

## 2019-01-31 DIAGNOSIS — Z01411 Encounter for gynecological examination (general) (routine) with abnormal findings: Secondary | ICD-10-CM | POA: Diagnosis not present

## 2019-01-31 DIAGNOSIS — R350 Frequency of micturition: Secondary | ICD-10-CM | POA: Diagnosis not present

## 2019-03-04 DIAGNOSIS — Z1231 Encounter for screening mammogram for malignant neoplasm of breast: Secondary | ICD-10-CM | POA: Diagnosis not present

## 2019-03-07 DIAGNOSIS — Z13220 Encounter for screening for lipoid disorders: Secondary | ICD-10-CM | POA: Diagnosis not present

## 2019-03-07 DIAGNOSIS — Z0000 Encounter for general adult medical examination without abnormal findings: Secondary | ICD-10-CM | POA: Diagnosis not present

## 2019-03-07 DIAGNOSIS — I10 Essential (primary) hypertension: Secondary | ICD-10-CM | POA: Diagnosis not present

## 2019-03-07 DIAGNOSIS — E663 Overweight: Secondary | ICD-10-CM | POA: Diagnosis not present

## 2019-03-07 DIAGNOSIS — Z23 Encounter for immunization: Secondary | ICD-10-CM | POA: Diagnosis not present

## 2019-03-28 DIAGNOSIS — G8929 Other chronic pain: Secondary | ICD-10-CM | POA: Diagnosis not present

## 2019-03-28 DIAGNOSIS — M5441 Lumbago with sciatica, right side: Secondary | ICD-10-CM | POA: Diagnosis not present

## 2019-03-30 ENCOUNTER — Other Ambulatory Visit: Payer: Self-pay | Admitting: Student

## 2019-03-30 DIAGNOSIS — M5441 Lumbago with sciatica, right side: Secondary | ICD-10-CM

## 2019-03-30 DIAGNOSIS — G8929 Other chronic pain: Secondary | ICD-10-CM

## 2019-04-06 ENCOUNTER — Encounter (INDEPENDENT_AMBULATORY_CARE_PROVIDER_SITE_OTHER): Payer: Self-pay

## 2019-04-06 ENCOUNTER — Ambulatory Visit
Admission: RE | Admit: 2019-04-06 | Discharge: 2019-04-06 | Disposition: A | Discharge status: Home / Self Care | Payer: 59 | Source: Ambulatory Visit | Attending: Student | Admitting: Student

## 2019-04-06 DIAGNOSIS — M5441 Lumbago with sciatica, right side: Secondary | ICD-10-CM

## 2019-04-06 DIAGNOSIS — M545 Low back pain: Secondary | ICD-10-CM | POA: Diagnosis not present

## 2019-04-06 DIAGNOSIS — G8929 Other chronic pain: Secondary | ICD-10-CM

## 2019-04-13 DIAGNOSIS — I10 Essential (primary) hypertension: Secondary | ICD-10-CM | POA: Diagnosis not present

## 2019-04-13 DIAGNOSIS — Z6841 Body Mass Index (BMI) 40.0 and over, adult: Secondary | ICD-10-CM | POA: Diagnosis not present

## 2019-04-13 DIAGNOSIS — M5440 Lumbago with sciatica, unspecified side: Secondary | ICD-10-CM | POA: Diagnosis not present

## 2019-05-29 DIAGNOSIS — M545 Low back pain: Secondary | ICD-10-CM | POA: Diagnosis not present

## 2019-05-29 DIAGNOSIS — R262 Difficulty in walking, not elsewhere classified: Secondary | ICD-10-CM | POA: Diagnosis not present

## 2019-05-29 DIAGNOSIS — M2569 Stiffness of other specified joint, not elsewhere classified: Secondary | ICD-10-CM | POA: Diagnosis not present

## 2019-05-29 DIAGNOSIS — M6281 Muscle weakness (generalized): Secondary | ICD-10-CM | POA: Diagnosis not present

## 2019-06-08 DIAGNOSIS — I10 Essential (primary) hypertension: Secondary | ICD-10-CM | POA: Diagnosis not present

## 2019-06-08 DIAGNOSIS — R600 Localized edema: Secondary | ICD-10-CM | POA: Diagnosis not present

## 2019-07-06 DIAGNOSIS — I10 Essential (primary) hypertension: Secondary | ICD-10-CM | POA: Diagnosis not present

## 2019-07-06 DIAGNOSIS — R600 Localized edema: Secondary | ICD-10-CM | POA: Diagnosis not present

## 2019-08-31 DIAGNOSIS — R600 Localized edema: Secondary | ICD-10-CM | POA: Diagnosis not present

## 2019-08-31 DIAGNOSIS — M4800 Spinal stenosis, site unspecified: Secondary | ICD-10-CM | POA: Diagnosis not present

## 2019-08-31 DIAGNOSIS — G479 Sleep disorder, unspecified: Secondary | ICD-10-CM | POA: Diagnosis not present

## 2019-08-31 DIAGNOSIS — I10 Essential (primary) hypertension: Secondary | ICD-10-CM | POA: Diagnosis not present

## 2019-09-08 DIAGNOSIS — R600 Localized edema: Secondary | ICD-10-CM | POA: Diagnosis not present

## 2019-09-14 DIAGNOSIS — R600 Localized edema: Secondary | ICD-10-CM | POA: Diagnosis not present

## 2019-09-14 DIAGNOSIS — I10 Essential (primary) hypertension: Secondary | ICD-10-CM | POA: Diagnosis not present

## 2019-09-14 DIAGNOSIS — G479 Sleep disorder, unspecified: Secondary | ICD-10-CM | POA: Diagnosis not present

## 2019-09-19 ENCOUNTER — Other Ambulatory Visit: Payer: Self-pay

## 2019-09-19 ENCOUNTER — Ambulatory Visit (HOSPITAL_COMMUNITY)
Admission: RE | Admit: 2019-09-19 | Discharge: 2019-09-19 | Disposition: A | Discharge status: Home / Self Care | Payer: 59 | Source: Ambulatory Visit | Attending: Internal Medicine | Admitting: Internal Medicine

## 2019-09-19 ENCOUNTER — Other Ambulatory Visit (HOSPITAL_COMMUNITY): Payer: Self-pay | Admitting: Internal Medicine

## 2019-09-19 DIAGNOSIS — M79605 Pain in left leg: Secondary | ICD-10-CM | POA: Diagnosis not present

## 2019-09-19 NOTE — Progress Notes (Signed)
Left lower extremity venous duplex has been completed. Preliminary results can be found in CV Proc through chart review.  Results were given to [place] at Dr. Idelle Crouch office.  09/19/19 1:20 PM Olen Cordial RVT

## 2019-09-20 DIAGNOSIS — M5416 Radiculopathy, lumbar region: Secondary | ICD-10-CM | POA: Diagnosis not present

## 2019-09-20 DIAGNOSIS — M25562 Pain in left knee: Secondary | ICD-10-CM | POA: Diagnosis not present

## 2019-09-26 DIAGNOSIS — M25562 Pain in left knee: Secondary | ICD-10-CM | POA: Diagnosis not present

## 2019-09-28 DIAGNOSIS — Z23 Encounter for immunization: Secondary | ICD-10-CM | POA: Diagnosis not present

## 2019-09-28 DIAGNOSIS — G4733 Obstructive sleep apnea (adult) (pediatric): Secondary | ICD-10-CM | POA: Diagnosis not present

## 2019-10-02 DIAGNOSIS — M25562 Pain in left knee: Secondary | ICD-10-CM | POA: Diagnosis not present

## 2019-10-12 DIAGNOSIS — G4733 Obstructive sleep apnea (adult) (pediatric): Secondary | ICD-10-CM | POA: Diagnosis not present

## 2019-10-19 DIAGNOSIS — S83232A Complex tear of medial meniscus, current injury, left knee, initial encounter: Secondary | ICD-10-CM | POA: Diagnosis not present

## 2019-10-19 DIAGNOSIS — M94262 Chondromalacia, left knee: Secondary | ICD-10-CM | POA: Diagnosis not present

## 2019-10-19 DIAGNOSIS — G8918 Other acute postprocedural pain: Secondary | ICD-10-CM | POA: Diagnosis not present

## 2019-10-19 DIAGNOSIS — S83272A Complex tear of lateral meniscus, current injury, left knee, initial encounter: Secondary | ICD-10-CM | POA: Diagnosis not present

## 2019-10-30 DIAGNOSIS — S83282D Other tear of lateral meniscus, current injury, left knee, subsequent encounter: Secondary | ICD-10-CM | POA: Diagnosis not present

## 2019-10-30 DIAGNOSIS — S83242D Other tear of medial meniscus, current injury, left knee, subsequent encounter: Secondary | ICD-10-CM | POA: Diagnosis not present

## 2019-11-12 DIAGNOSIS — G4733 Obstructive sleep apnea (adult) (pediatric): Secondary | ICD-10-CM | POA: Diagnosis not present

## 2019-11-20 DIAGNOSIS — S83242D Other tear of medial meniscus, current injury, left knee, subsequent encounter: Secondary | ICD-10-CM | POA: Diagnosis not present

## 2019-11-20 DIAGNOSIS — S83282D Other tear of lateral meniscus, current injury, left knee, subsequent encounter: Secondary | ICD-10-CM | POA: Diagnosis not present

## 2019-11-21 DIAGNOSIS — S83242D Other tear of medial meniscus, current injury, left knee, subsequent encounter: Secondary | ICD-10-CM | POA: Diagnosis not present

## 2019-11-21 DIAGNOSIS — M25662 Stiffness of left knee, not elsewhere classified: Secondary | ICD-10-CM | POA: Diagnosis not present

## 2019-11-21 DIAGNOSIS — S83282D Other tear of lateral meniscus, current injury, left knee, subsequent encounter: Secondary | ICD-10-CM | POA: Diagnosis not present

## 2019-11-21 DIAGNOSIS — M6281 Muscle weakness (generalized): Secondary | ICD-10-CM | POA: Diagnosis not present

## 2019-11-23 DIAGNOSIS — S83242D Other tear of medial meniscus, current injury, left knee, subsequent encounter: Secondary | ICD-10-CM | POA: Diagnosis not present

## 2019-11-23 DIAGNOSIS — S83282D Other tear of lateral meniscus, current injury, left knee, subsequent encounter: Secondary | ICD-10-CM | POA: Diagnosis not present

## 2019-11-23 DIAGNOSIS — M25662 Stiffness of left knee, not elsewhere classified: Secondary | ICD-10-CM | POA: Diagnosis not present

## 2019-11-23 DIAGNOSIS — M6281 Muscle weakness (generalized): Secondary | ICD-10-CM | POA: Diagnosis not present

## 2019-11-28 DIAGNOSIS — M6281 Muscle weakness (generalized): Secondary | ICD-10-CM | POA: Diagnosis not present

## 2019-11-28 DIAGNOSIS — S83282D Other tear of lateral meniscus, current injury, left knee, subsequent encounter: Secondary | ICD-10-CM | POA: Diagnosis not present

## 2019-11-28 DIAGNOSIS — M25562 Pain in left knee: Secondary | ICD-10-CM | POA: Diagnosis not present

## 2019-11-28 DIAGNOSIS — M25662 Stiffness of left knee, not elsewhere classified: Secondary | ICD-10-CM | POA: Diagnosis not present

## 2019-11-30 DIAGNOSIS — M6281 Muscle weakness (generalized): Secondary | ICD-10-CM | POA: Diagnosis not present

## 2019-11-30 DIAGNOSIS — S83282D Other tear of lateral meniscus, current injury, left knee, subsequent encounter: Secondary | ICD-10-CM | POA: Diagnosis not present

## 2019-11-30 DIAGNOSIS — M25562 Pain in left knee: Secondary | ICD-10-CM | POA: Diagnosis not present

## 2019-11-30 DIAGNOSIS — M25662 Stiffness of left knee, not elsewhere classified: Secondary | ICD-10-CM | POA: Diagnosis not present

## 2019-12-04 DIAGNOSIS — Z23 Encounter for immunization: Secondary | ICD-10-CM | POA: Diagnosis not present

## 2019-12-05 DIAGNOSIS — M6281 Muscle weakness (generalized): Secondary | ICD-10-CM | POA: Diagnosis not present

## 2019-12-05 DIAGNOSIS — M25562 Pain in left knee: Secondary | ICD-10-CM | POA: Diagnosis not present

## 2019-12-05 DIAGNOSIS — M25662 Stiffness of left knee, not elsewhere classified: Secondary | ICD-10-CM | POA: Diagnosis not present

## 2019-12-05 DIAGNOSIS — S83282D Other tear of lateral meniscus, current injury, left knee, subsequent encounter: Secondary | ICD-10-CM | POA: Diagnosis not present

## 2019-12-07 DIAGNOSIS — S83242D Other tear of medial meniscus, current injury, left knee, subsequent encounter: Secondary | ICD-10-CM | POA: Diagnosis not present

## 2019-12-07 DIAGNOSIS — M6281 Muscle weakness (generalized): Secondary | ICD-10-CM | POA: Diagnosis not present

## 2019-12-07 DIAGNOSIS — S83282D Other tear of lateral meniscus, current injury, left knee, subsequent encounter: Secondary | ICD-10-CM | POA: Diagnosis not present

## 2019-12-07 DIAGNOSIS — M25662 Stiffness of left knee, not elsewhere classified: Secondary | ICD-10-CM | POA: Diagnosis not present

## 2019-12-11 DIAGNOSIS — M25562 Pain in left knee: Secondary | ICD-10-CM | POA: Diagnosis not present

## 2019-12-11 DIAGNOSIS — M25662 Stiffness of left knee, not elsewhere classified: Secondary | ICD-10-CM | POA: Diagnosis not present

## 2019-12-11 DIAGNOSIS — S83242D Other tear of medial meniscus, current injury, left knee, subsequent encounter: Secondary | ICD-10-CM | POA: Diagnosis not present

## 2019-12-11 DIAGNOSIS — M6281 Muscle weakness (generalized): Secondary | ICD-10-CM | POA: Diagnosis not present

## 2019-12-13 DIAGNOSIS — G4733 Obstructive sleep apnea (adult) (pediatric): Secondary | ICD-10-CM | POA: Diagnosis not present

## 2020-01-01 DIAGNOSIS — G4733 Obstructive sleep apnea (adult) (pediatric): Secondary | ICD-10-CM | POA: Diagnosis not present

## 2020-01-08 DIAGNOSIS — M25562 Pain in left knee: Secondary | ICD-10-CM | POA: Diagnosis not present

## 2020-01-12 DIAGNOSIS — G4733 Obstructive sleep apnea (adult) (pediatric): Secondary | ICD-10-CM | POA: Diagnosis not present

## 2020-01-16 DIAGNOSIS — G4733 Obstructive sleep apnea (adult) (pediatric): Secondary | ICD-10-CM | POA: Diagnosis not present

## 2020-02-02 DIAGNOSIS — Z6838 Body mass index (BMI) 38.0-38.9, adult: Secondary | ICD-10-CM | POA: Diagnosis not present

## 2020-02-02 DIAGNOSIS — Z01411 Encounter for gynecological examination (general) (routine) with abnormal findings: Secondary | ICD-10-CM | POA: Diagnosis not present

## 2020-02-02 DIAGNOSIS — I10 Essential (primary) hypertension: Secondary | ICD-10-CM | POA: Diagnosis not present

## 2020-02-12 DIAGNOSIS — G4733 Obstructive sleep apnea (adult) (pediatric): Secondary | ICD-10-CM | POA: Diagnosis not present

## 2020-02-16 DIAGNOSIS — G4733 Obstructive sleep apnea (adult) (pediatric): Secondary | ICD-10-CM | POA: Diagnosis not present

## 2020-03-05 DIAGNOSIS — R69 Illness, unspecified: Secondary | ICD-10-CM | POA: Diagnosis not present

## 2020-03-11 DIAGNOSIS — G4733 Obstructive sleep apnea (adult) (pediatric): Secondary | ICD-10-CM | POA: Diagnosis not present

## 2020-03-13 DIAGNOSIS — G4733 Obstructive sleep apnea (adult) (pediatric): Secondary | ICD-10-CM | POA: Diagnosis not present

## 2020-03-17 DIAGNOSIS — G4733 Obstructive sleep apnea (adult) (pediatric): Secondary | ICD-10-CM | POA: Diagnosis not present

## 2020-03-19 DIAGNOSIS — Z1231 Encounter for screening mammogram for malignant neoplasm of breast: Secondary | ICD-10-CM | POA: Diagnosis not present

## 2020-03-26 DIAGNOSIS — R69 Illness, unspecified: Secondary | ICD-10-CM | POA: Diagnosis not present

## 2020-03-26 DIAGNOSIS — Z0000 Encounter for general adult medical examination without abnormal findings: Secondary | ICD-10-CM | POA: Diagnosis not present

## 2020-03-26 DIAGNOSIS — I10 Essential (primary) hypertension: Secondary | ICD-10-CM | POA: Diagnosis not present

## 2020-03-26 DIAGNOSIS — Z13220 Encounter for screening for lipoid disorders: Secondary | ICD-10-CM | POA: Diagnosis not present

## 2020-03-26 DIAGNOSIS — G4733 Obstructive sleep apnea (adult) (pediatric): Secondary | ICD-10-CM | POA: Diagnosis not present

## 2020-04-11 DIAGNOSIS — G4733 Obstructive sleep apnea (adult) (pediatric): Secondary | ICD-10-CM | POA: Diagnosis not present

## 2020-04-13 DIAGNOSIS — G4733 Obstructive sleep apnea (adult) (pediatric): Secondary | ICD-10-CM | POA: Diagnosis not present

## 2020-05-12 DIAGNOSIS — G4733 Obstructive sleep apnea (adult) (pediatric): Secondary | ICD-10-CM | POA: Diagnosis not present

## 2020-05-14 DIAGNOSIS — G4733 Obstructive sleep apnea (adult) (pediatric): Secondary | ICD-10-CM | POA: Diagnosis not present

## 2020-06-11 DIAGNOSIS — G4733 Obstructive sleep apnea (adult) (pediatric): Secondary | ICD-10-CM | POA: Diagnosis not present

## 2020-06-13 DIAGNOSIS — G4733 Obstructive sleep apnea (adult) (pediatric): Secondary | ICD-10-CM | POA: Diagnosis not present

## 2020-06-14 DIAGNOSIS — G4733 Obstructive sleep apnea (adult) (pediatric): Secondary | ICD-10-CM | POA: Diagnosis not present

## 2020-11-04 ENCOUNTER — Ambulatory Visit: Payer: Self-pay

## 2020-11-04 ENCOUNTER — Other Ambulatory Visit: Payer: Self-pay

## 2020-11-04 ENCOUNTER — Other Ambulatory Visit: Payer: Self-pay | Admitting: Occupational Medicine

## 2020-11-04 DIAGNOSIS — M25511 Pain in right shoulder: Secondary | ICD-10-CM

## 2020-11-04 DIAGNOSIS — M25561 Pain in right knee: Secondary | ICD-10-CM

## 2020-11-04 DIAGNOSIS — M25562 Pain in left knee: Secondary | ICD-10-CM

## 2020-12-02 ENCOUNTER — Other Ambulatory Visit: Payer: Self-pay | Admitting: Occupational Medicine

## 2020-12-02 MED ORDER — TRAMADOL HCL 50 MG PO TABS: 50.0000 mg | ORAL_TABLET | Freq: Three times a day (TID) | ORAL | 0 refills | Status: AC | PRN

## 2021-01-16 ENCOUNTER — Ambulatory Visit (INDEPENDENT_AMBULATORY_CARE_PROVIDER_SITE_OTHER): Payer: Managed Care, Other (non HMO) | Admitting: Podiatry

## 2021-01-16 ENCOUNTER — Other Ambulatory Visit: Payer: Self-pay

## 2021-01-16 VITALS — BP 143/90 | HR 84 | Temp 98.4°F

## 2021-01-16 DIAGNOSIS — B351 Tinea unguium: Secondary | ICD-10-CM

## 2021-01-19 NOTE — Progress Notes (Signed)
Subjective:   Patient ID: Samantha Burnett, female   DOB: 60 y.o.   MRN: [id number]   HPI 60 year old female presents the office today for concerns of nail fungus to her left first and second digit toenails which is been ongoing about 2 years.  She states the nails are thick and discolored.  No significant discomfort.  No swelling redness or any drainage.  No open lesions.  She has no other concerns today.   Review of Systems  All other systems reviewed and are negative.  Past Medical History:  Diagnosis Date   Anemia    Anxiety    Arthritis    Hypertension     Past Surgical History:  Procedure Laterality Date   bone spurs     back   COLONOSCOPY     EYE SURGERY     lasick    MYOMECTOMY     TUBAL LIGATION  1989     Current Outpatient Medications:    Artificial Tear Ointment (DRY EYES OP), Place 1 drop into both eyes daily as needed (for dry eyes)., Disp: , Rfl:    B Complex Vitamins (VITAMIN B-COMPLEX PO), Take 1 tablet by mouth daily as needed (for energy). , Disp: , Rfl:    escitalopram (LEXAPRO) 20 MG tablet, Take 20 mg by mouth daily., Disp: , Rfl:    ferrous sulfate 325 (65 FE) MG tablet, Take 325 mg by mouth daily as needed (for iron supplementation). , Disp: , Rfl:    HYDROcodone bit-homatropine (HYCODAN) 5-1.5 MG/5ML syrup, hydrocodone-homatropine 5 mg-1.5 mg/5 mL oral syrup  take 5 milliliters (1 teaspoonful) by mouth at bedtime if needed, Disp: , Rfl:    losartan (COZAAR) 25 MG tablet, Take 25 mg by mouth daily., Disp: , Rfl:    Multiple Vitamins-Minerals (MULTIVITAMIN WITH MINERALS) tablet, Take 1 tablet by mouth daily., Disp: , Rfl:    polyethylene glycol-electrolytes (NULYTELY) 420 g solution, See admin instructions., Disp: , Rfl:    Turmeric (CURCUMIN 95) 500 MG CAPS, Take 1 tablet by mouth daily., Disp: , Rfl:   Allergies  Allergen Reactions   Penicillins Hives, Itching and Rash    Has patient had a PCN reaction causing immediate rash, facial/tongue/throat  swelling, SOB or lightheadedness with hypotension: Yes Has patient had a PCN reaction causing severe rash involving mucus membranes or skin necrosis: No Has patient had a PCN reaction that required hospitalization No Has patient had a PCN reaction occurring within the last 10 years: No If all of the above answers are "NO", then may proceed with Cephalosporin use.           Objective:  Physical Exam  General: AAO x3, NAD  Dermatological: Left hallux and second digit toenails are hypertrophic, dystrophic with yellow-Schubert discoloration.  There is no surrounding hyperpigmentation of the nail.  There is no drainage or pus.  No edema, erythema or signs of infection.  Vascular: Dorsalis Pedis artery and Posterior Tibial artery pedal pulses are 2/4 bilateral with immedate capillary fill time. There is no pain with calf compression, swelling, warmth, erythema.   Neruologic: Grossly intact via light touch bilateral.   Musculoskeletal: No gross boney pedal deformities bilateral. No pain, crepitus, or limitation noted with foot and ankle range of motion bilateral. Muscular strength 5/5 in all groups tested bilateral.  Gait: Unassisted, Nonantalgic.       Assessment:   60 year old female onychomycosis     Plan:  -Treatment options discussed including all alternatives, risks, and complications -Etiology of  symptoms were discussed -Sharply debrided the symptomatic nails with any complications and sent this for culture, pathology to [facility] labs.  We discussed treatment options: Oral, topical.  She wants to go and start the topical I ordered a compound cream through [facility] for onychomycosis and if needed will likely proceed with Lamisil pending the culture result.  Vivi Barrack DPM

## 2021-02-04 ENCOUNTER — Telehealth: Payer: Self-pay | Admitting: *Deleted

## 2021-02-04 NOTE — Telephone Encounter (Signed)
Called and spoke with the patient and relayed the message per Dr Ardelle Anton and patient stated that the skin did look a little bit more darker than it was when the patient first come to see Dr Ardelle Anton and I stated that I would let Dr Ardelle Anton know. Misty Stanley

## 2021-02-04 NOTE — Telephone Encounter (Signed)
-----   Message from Vivi Barrack, DPM sent at 01/31/2021  2:03 PM EST ----- Misty Stanley- can you please let her know that the culture did show fungus. I had ordered a topical through [place]. Can you see if she got that? We can also do the oral Lamisil. We had discussed this in the office. If she wanted to do the oral I would need to check blood work prior. Otherwise we can do the topical for a few months to see how it does and if not responding then try the oral.   As an aside it did show melanin pigment in the nail, likely benign. If there is any worsening of the darkness to the nail or spreads to the skin to let me know.   Thanks.

## 2021-02-10 ENCOUNTER — Other Ambulatory Visit: Payer: Self-pay | Admitting: Podiatry

## 2021-02-10 DIAGNOSIS — Z79899 Other long term (current) drug therapy: Secondary | ICD-10-CM

## 2021-02-10 NOTE — Progress Notes (Signed)
Blood work ordered for Lamisil.

## 2021-02-19 ENCOUNTER — Telehealth: Payer: Self-pay | Admitting: *Deleted

## 2021-02-19 ENCOUNTER — Other Ambulatory Visit: Payer: Self-pay | Admitting: Podiatry

## 2021-02-19 DIAGNOSIS — Z79899 Other long term (current) drug therapy: Secondary | ICD-10-CM

## 2021-02-19 LAB — CBC WITH DIFFERENTIAL/PLATELET
Absolute Monocytes: 449 cells/uL (ref 200–950)
Basophils Absolute: 48 cells/uL (ref 0–200)
Basophils Relative: 0.7 %
Eosinophils Absolute: 122 cells/uL (ref 15–500)
Eosinophils Relative: 1.8 %
HCT: 41.6 % (ref 35.0–45.0)
Hemoglobin: 13.9 g/dL (ref 11.7–15.5)
Lymphs Abs: 3019 cells/uL (ref 850–3900)
MCH: 30.8 pg (ref 27.0–33.0)
MCHC: 33.4 g/dL (ref 32.0–36.0)
MCV: 92.2 fL (ref 80.0–100.0)
MPV: 10 fL (ref 7.5–12.5)
Monocytes Relative: 6.6 %
Neutro Abs: 3162 cells/uL (ref 1500–7800)
Neutrophils Relative %: 46.5 %
Platelets: 258 10*3/uL (ref 140–400)
RBC: 4.51 10*6/uL (ref 3.80–5.10)
RDW: 13 % (ref 11.0–15.0)
Total Lymphocyte: 44.4 %
WBC: 6.8 10*3/uL (ref 3.8–10.8)

## 2021-02-19 LAB — HEPATIC FUNCTION PANEL
AG Ratio: 1.5 (calc) (ref 1.0–2.5)
ALT: 22 U/L (ref 6–29)
AST: 29 U/L (ref 10–35)
Albumin: 4.5 g/dL (ref 3.6–5.1)
Alkaline phosphatase (APISO): 110 U/L (ref 37–153)
Bilirubin, Direct: 0.1 mg/dL (ref 0.0–0.2)
Globulin: 3.1 g/dL (calc) (ref 1.9–3.7)
Indirect Bilirubin: 0.3 mg/dL (calc) (ref 0.2–1.2)
Total Bilirubin: 0.4 mg/dL (ref 0.2–1.2)
Total Protein: 7.6 g/dL (ref 6.1–8.1)

## 2021-02-19 MED ORDER — TERBINAFINE HCL 250 MG PO TABS: 250.0000 mg | ORAL_TABLET | Freq: Every day | ORAL | 0 refills | Status: AC

## 2021-02-19 NOTE — Telephone Encounter (Signed)
Called and spoke with the patient and relayed the message per Dr Wagoner. Barton Want 

## 2021-02-19 NOTE — Telephone Encounter (Signed)
-----   Message from Vivi Barrack, DPM sent at 02/19/2021  5:26 PM EST ----- Misty Stanley- can you let her know that the blood work is normal. Sent Lamisil to the pharmacy. I have put in an updated order for blood work to recheck CBC and LFT in 4-6 weeks. If there are any side affects or issues with the medication to stop and let me know. Thanks.

## 2021-03-08 IMAGING — CT CT L SPINE W/O CM
3 of 4 series · 12 of 33 positions shown, 14 images · non-contrast
Comparison: CT scan of the lumbar spine dated 10/29/2015

CLINICAL DATA: Chronic right-sided low back pain with right-sided
sciatica. Previous fusion at L3-4 and L4-5.

EXAM:
CT LUMBAR SPINE WITHOUT CONTRAST
TECHNIQUE: Multidetector CT imaging of the lumbar spine was performed without
intravenous contrast administration. Multiplanar CT image
reconstructions were also generated.

[Series 3: l-spine 2.00 br40 s3 lspine st · axial · 0.36mm/px · z∈[+1516,+1668]mm · 4 of 114 slices shown, 5 images]
[im 19/114  soft-tissue]
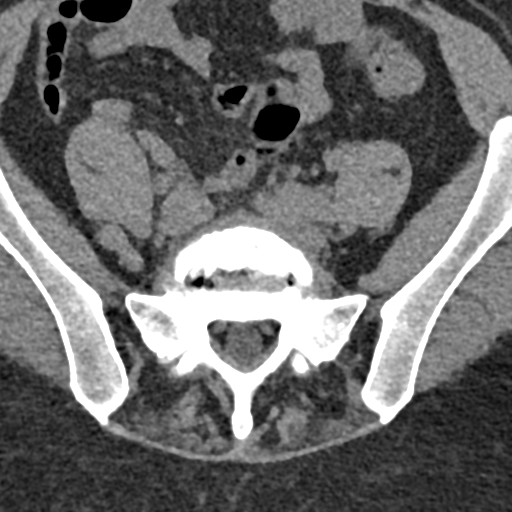
[im 19/114  bone]
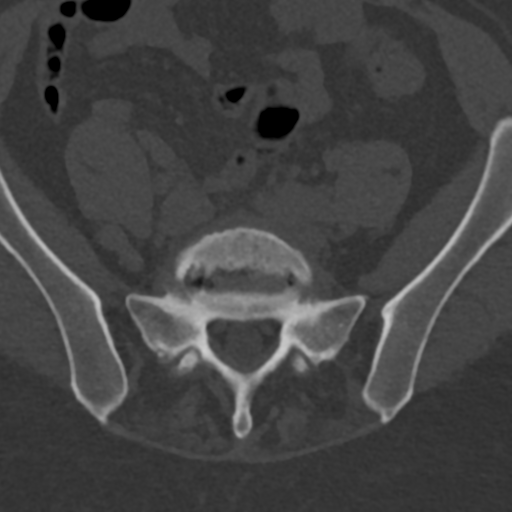
[im 38/114  bone]
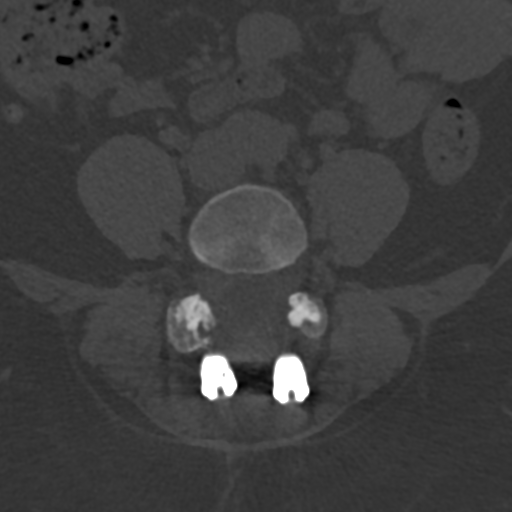
[im 76/114  bone]
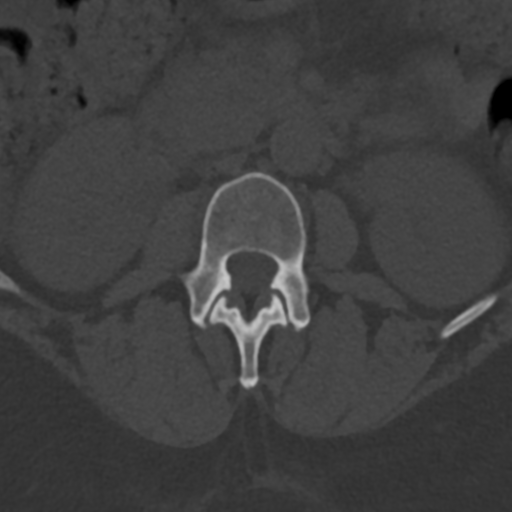
[im 95/114  bone]
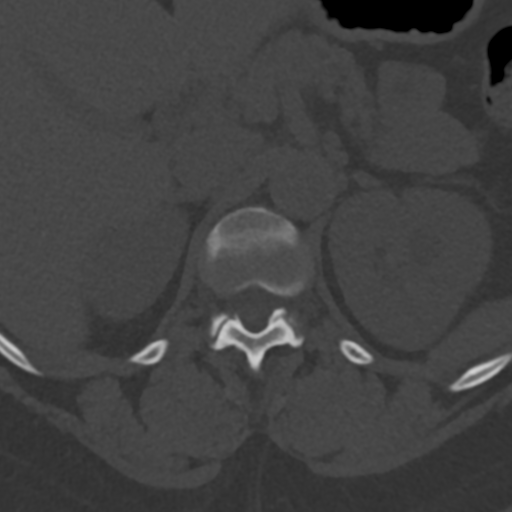

[Series 5: l-spine 2.00 br60 s3 sag bone · sagittal · 0.36mm/px · 5 of 46 slices shown, 6 images]
[im 16/46  bone]
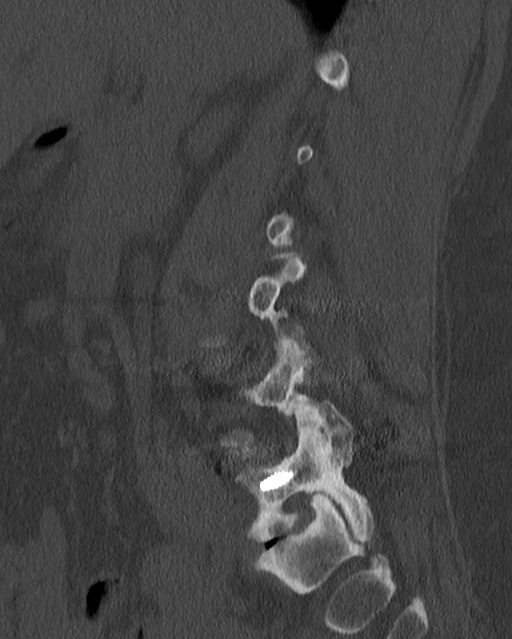
[im 19/46  bone]
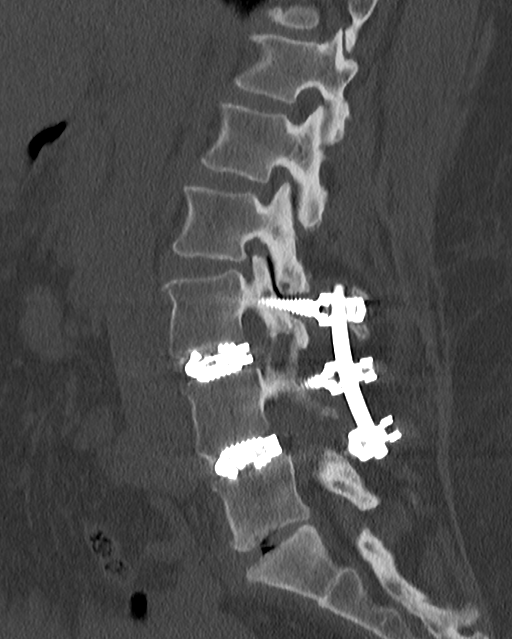
[im 23/46  soft-tissue]
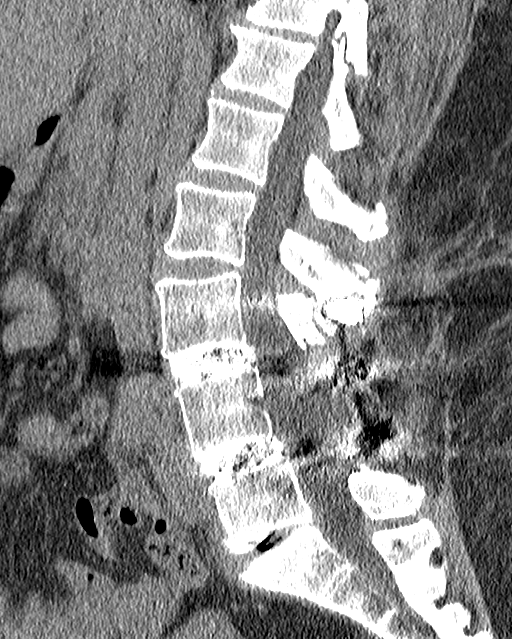
[im 23/46  bone]
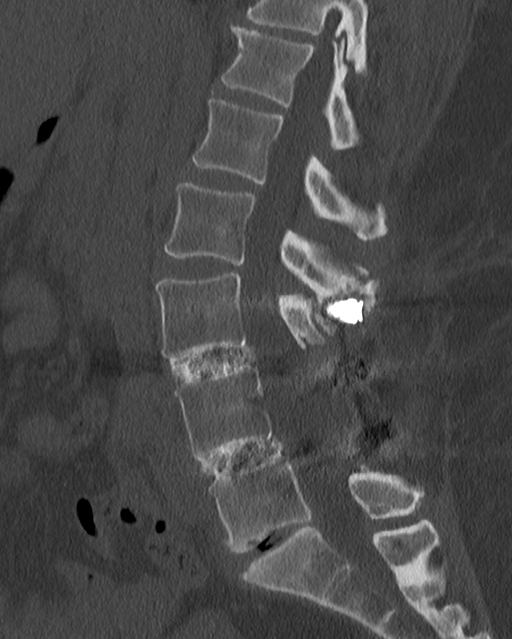
[im 27/46  bone]
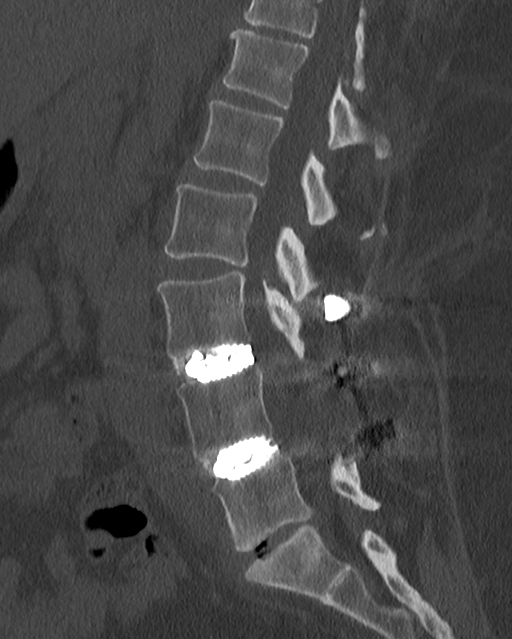
[im 31/46  bone]
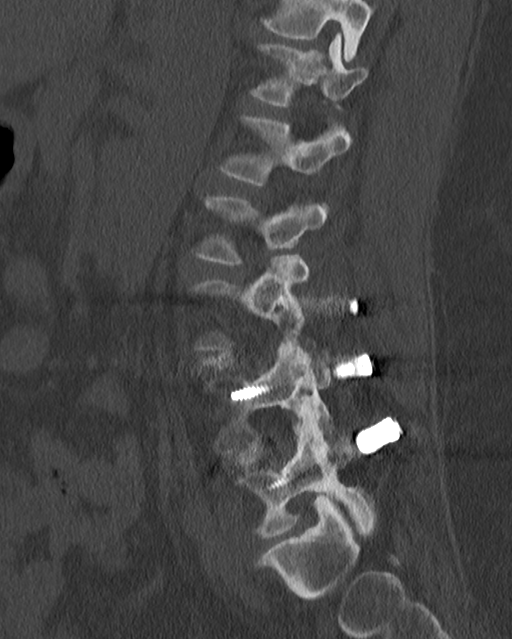

[Series 7: l-spine 2.00 br60 s3 cor bone · coronal · 0.27mm/px · 3 of 91 slices shown]
[im 19/91  bone]
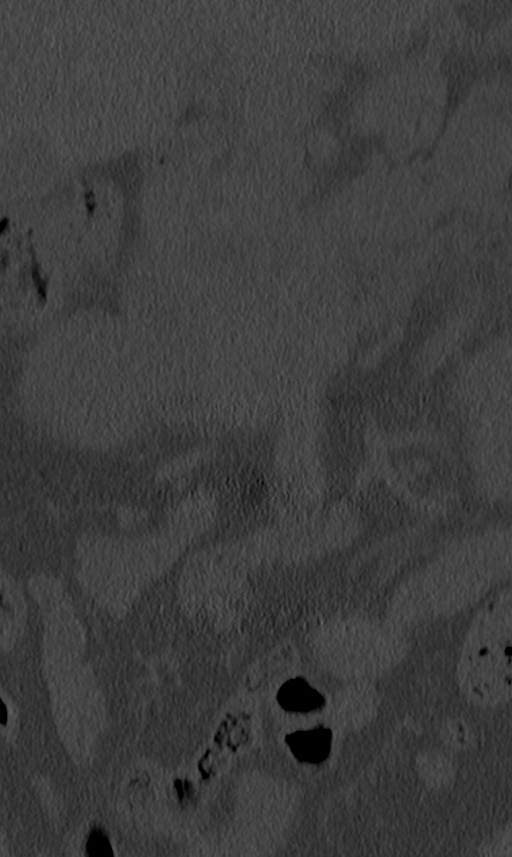
[im 37/91  bone]
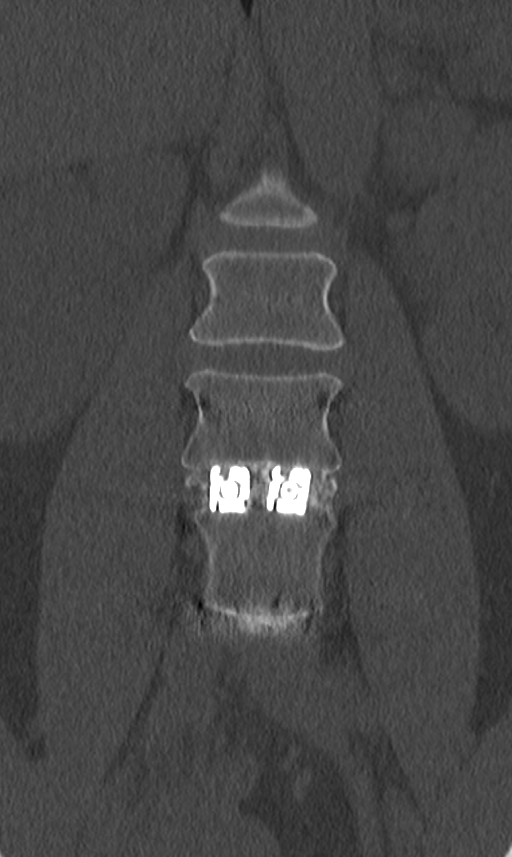
[im 55/91  bone]
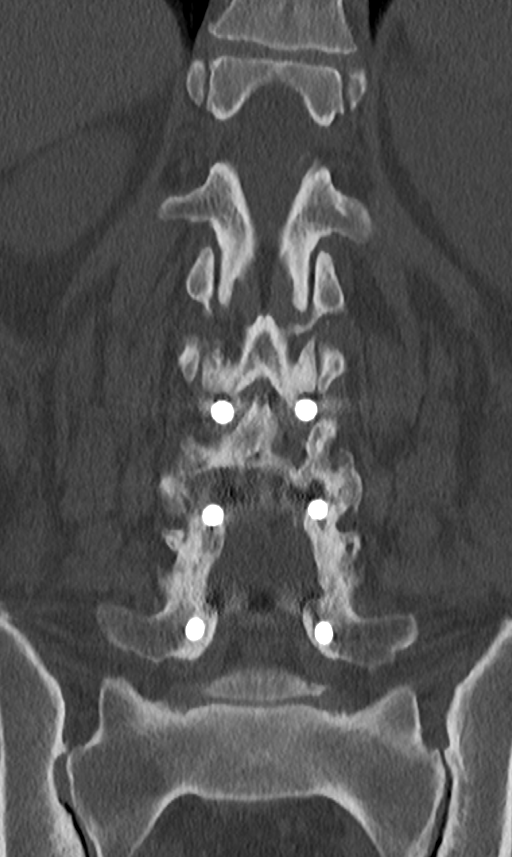

[12 of 33 positions shown; findings below may reference images not displayed]

FINDINGS: Segmentation: 5 lumbar type vertebrae.

Alignment: Normal.

Vertebrae: Solid interbody and posterior fusions at L3-4 and L4-5.
No loosening of the hardware. Hardware is in good position. No
fractures or bone destruction.

Paraspinal and other soft tissues: No significant abnormalities. 12
mm low-density lesion in the upper pole the left kidney is likely a
cyst.

Disc levels: T11-12: Slight disc space narrowing. No disc bulging or
protrusion. Calcification of the right ligamentum flavum with slight
narrowing of the spinal canal, unchanged.

T12-L1: No significant abnormality.

L1-2: No significant abnormality.

L2-3: Broad-based disc bulge. Congenitally short pedicles with
slight hypertrophy of the ligamentum flavum and facet joints.
Lateral recesses are compressed on image 50 of series 3. These
combine to create new moderately severe spinal stenosis, best seen
on image 49 of series 3.

L3-4 and L4-5: Solid interbody and posterior fusion with excellent
posterior decompression of the thecal sac at both levels. No neural
impingement.

L5-S1: New degenerative disc disease with a vacuum phenomenon. Tiny
broad-based disc bulge without neural impingement. No foraminal
stenosis. Moderate right and mild left facet arthritis, new since
the prior study.
IMPRESSION: 1. New moderately severe spinal stenosis at L2-3.
2. New moderate right and mild left facet arthritis at L5-S1 without
neural impingement.
3. Solid interbody and posterior fusions at L3-4 and L4-5 with
excellent posterior decompression of the thecal sac at both levels.

## 2021-08-21 DIAGNOSIS — J069 Acute upper respiratory infection, unspecified: Secondary | ICD-10-CM | POA: Diagnosis not present

## 2021-08-21 DIAGNOSIS — R7303 Prediabetes: Secondary | ICD-10-CM | POA: Diagnosis not present

## 2021-08-21 DIAGNOSIS — I10 Essential (primary) hypertension: Secondary | ICD-10-CM | POA: Diagnosis not present

## 2021-09-05 DIAGNOSIS — G4733 Obstructive sleep apnea (adult) (pediatric): Secondary | ICD-10-CM | POA: Diagnosis not present

## 2021-10-05 DIAGNOSIS — G4733 Obstructive sleep apnea (adult) (pediatric): Secondary | ICD-10-CM | POA: Diagnosis not present

## 2021-11-05 DIAGNOSIS — G4733 Obstructive sleep apnea (adult) (pediatric): Secondary | ICD-10-CM | POA: Diagnosis not present

## 2022-03-01 DIAGNOSIS — H6991 Unspecified Eustachian tube disorder, right ear: Secondary | ICD-10-CM | POA: Diagnosis not present

## 2022-03-01 DIAGNOSIS — J029 Acute pharyngitis, unspecified: Secondary | ICD-10-CM | POA: Diagnosis not present

## 2022-03-02 ENCOUNTER — Ambulatory Visit: Payer: Managed Care, Other (non HMO) | Admitting: Podiatry

## 2022-03-12 DIAGNOSIS — Z124 Encounter for screening for malignant neoplasm of cervix: Secondary | ICD-10-CM | POA: Diagnosis not present

## 2022-03-12 DIAGNOSIS — I10 Essential (primary) hypertension: Secondary | ICD-10-CM | POA: Diagnosis not present

## 2022-03-12 DIAGNOSIS — Z6839 Body mass index (BMI) 39.0-39.9, adult: Secondary | ICD-10-CM | POA: Diagnosis not present

## 2022-03-12 DIAGNOSIS — Z01411 Encounter for gynecological examination (general) (routine) with abnormal findings: Secondary | ICD-10-CM | POA: Diagnosis not present

## 2022-03-27 DIAGNOSIS — Z1231 Encounter for screening mammogram for malignant neoplasm of breast: Secondary | ICD-10-CM | POA: Diagnosis not present

## 2022-05-10 ENCOUNTER — Other Ambulatory Visit: Payer: Self-pay

## 2022-05-10 ENCOUNTER — Emergency Department (HOSPITAL_BASED_OUTPATIENT_CLINIC_OR_DEPARTMENT_OTHER)
Admission: EM | Admit: 2022-05-10 | Discharge: 2022-05-10 | Disposition: A | Discharge status: Home / Self Care | Payer: BC Managed Care – PPO | Attending: Emergency Medicine | Admitting: Emergency Medicine

## 2022-05-10 ENCOUNTER — Encounter (HOSPITAL_BASED_OUTPATIENT_CLINIC_OR_DEPARTMENT_OTHER): Payer: Self-pay | Admitting: Emergency Medicine

## 2022-05-10 DIAGNOSIS — Z79899 Other long term (current) drug therapy: Secondary | ICD-10-CM | POA: Diagnosis not present

## 2022-05-10 DIAGNOSIS — M5416 Radiculopathy, lumbar region: Secondary | ICD-10-CM | POA: Insufficient documentation

## 2022-05-10 DIAGNOSIS — M79605 Pain in left leg: Secondary | ICD-10-CM | POA: Diagnosis not present

## 2022-05-10 MED ORDER — METHOCARBAMOL 500 MG PO TABS: 500.0000 mg | ORAL_TABLET | Freq: Two times a day (BID) | ORAL | 0 refills | Status: AC

## 2022-05-10 MED ORDER — METHYLPREDNISOLONE ACETATE 80 MG/ML IJ SUSP
40.0000 mg | Freq: Once | INTRAMUSCULAR | Status: AC
  Administered 2022-05-10: 40 mg via INTRAMUSCULAR
  Filled 2022-05-10: qty 1

## 2022-05-10 NOTE — Discharge Instructions (Addendum)
As prescribed.  Do not drive or operate machinery while taking Robaxin.  Follow-up with your neurosurgeon as discussed.

## 2022-05-10 NOTE — ED Provider Notes (Signed)
[facility] [place] [facility] [place] Provider Note   CSN: [id number] Arrival date & time: 05/10/22  1247     History  Chief Complaint  Patient presents with   Leg Pain    Samantha Burnett is a 62 y.o. female.  62 year old female presents with concern for pain which originates around her left posterior iliac crest area, around to left groin and down left inner leg to foot.  Pain is worse if she stretches her left arm up or if she starts walking.  No falls or injuries.  Reports prior history of bone spur on her spine that was removed by neurosurgery, has not had complications since.  Denies abdominal pain, weakness or numbness.  No other complaints or concerns.       Home Medications Prior to Admission medications   Medication Sig Start Date End Date Taking? Authorizing Provider  methocarbamol (ROBAXIN) 500 MG tablet Take 1 tablet (500 mg total) by mouth 2 (two) times daily. 05/10/22  Yes Tacy Learn, PA-C  Artificial Tear Ointment (DRY EYES OP) Place 1 drop into both eyes daily as needed (for dry eyes).    [provider]  B Complex Vitamins (VITAMIN B-COMPLEX PO) Take 1 tablet by mouth daily as needed (for energy).     [provider]  escitalopram (LEXAPRO) 20 MG tablet Take 20 mg by mouth daily. 12/16/20   [provider]  ferrous sulfate 325 (65 FE) MG tablet Take 325 mg by mouth daily as needed (for iron supplementation).     [provider]  HYDROcodone bit-homatropine (HYCODAN) 5-1.5 MG/5ML syrup hydrocodone-homatropine 5 mg-1.5 mg/5 mL oral syrup  take 5 milliliters (1 teaspoonful) by mouth at bedtime if needed    [provider]  losartan (COZAAR) 25 MG tablet Take 25 mg by mouth daily. 12/17/20   [provider]  Multiple Vitamins-Minerals (MULTIVITAMIN WITH MINERALS) tablet Take 1 tablet by mouth daily.    [provider]  NON [facility]  Anti-fungal (nail)-#1     [provider]  polyethylene glycol-electrolytes (NULYTELY) 420 g solution See admin instructions. 08/16/20   [provider]  terbinafine (LAMISIL) 250 MG tablet Take 1 tablet (250 mg total) by mouth daily. 02/19/21   Trula Slade, DPM  Turmeric (CURCUMIN 95) 500 MG CAPS Take 1 tablet by mouth daily.    [provider]      Allergies    Penicillins    Review of Systems   Review of Systems Negative except as per HPI Physical Exam Updated Vital Signs BP (!) 146/105   Pulse 66   Temp 98.4 F (36.9 C) (Oral)   Resp 15   Ht 5' (1.524 m)   Wt 90.7 kg   LMP 02/18/2015   SpO2 100%   BMI 39.06 kg/m  Physical Exam Vitals and nursing note reviewed.  Constitutional:      General: She is not in acute distress.    Appearance: She is well-developed. She is not diaphoretic.  HENT:     Head: Normocephalic and atraumatic.  Cardiovascular:     Pulses: Normal pulses.  Pulmonary:     Effort: Pulmonary effort is normal.  Abdominal:     Palpations: Abdomen is soft.     Tenderness: There is no abdominal tenderness.  Musculoskeletal:        General: No swelling, tenderness or deformity.     Right lower leg: No edema.     Left lower leg:  No edema.  Skin:    General: Skin is warm and dry.     Findings: No erythema or rash.  Neurological:     Mental Status: She is alert and oriented to person, place, and time.     Sensory: No sensory deficit.     Motor: No weakness.     Gait: Gait normal.     Deep Tendon Reflexes: Reflexes normal.  Psychiatric:        Behavior: Behavior normal.     ED Results / Procedures / Treatments   Labs (all labs ordered are listed, but only abnormal results are displayed) Labs Reviewed - No data to display  EKG None  Radiology No results found.  Procedures Procedures    Medications Ordered in ED Medications  methylPREDNISolone acetate (DEPO-MEDROL) injection 40 mg (40 mg Intramuscular Given 05/10/22 1516)    ED  Course/ Medical Decision Making/ A&P                             Medical Decision Making Risk Prescription drug management.   62 year old female presents with complaint of pain around left iliac crest down left inner thigh to ankle with complaint of paresthesias in her toes.  Symptoms occur with overhead reaching with her left arm as well as with ambulation.  Lower extremity strength symmetric, reflexes intact, DP pulses present.  No overlying skin changes.  Pain is not reproduced with palpation through the lumbar spine or with range of motion of left lower extremity.  Recommend patient follow-up with her neurosurgeon as she states this is similar to when she had a bone spur in her lumbar spine that was removed several years ago.  Offered x-ray, patient declines.  Declines prednisone taper, is agreeable to IM Depo-Medrol today as well as prescription for muscle relaxant.        Final Clinical Impression(s) / ED Diagnoses Final diagnoses:  Lumbar radiculopathy    Rx / DC Orders ED Discharge Orders          Ordered    methocarbamol (ROBAXIN) 500 MG tablet  2 times daily        05/10/22 1448              Tacy Learn, PA-C 05/10/22 1805    Gareth Morgan, MD 05/10/22 2355

## 2022-05-10 NOTE — ED Triage Notes (Signed)
Pt c/o pain from LT groin that radiates to LLE, esp when ambulating x 1 wk; no injury

## 2022-05-14 DIAGNOSIS — Z6841 Body Mass Index (BMI) 40.0 and over, adult: Secondary | ICD-10-CM | POA: Diagnosis not present

## 2022-05-14 DIAGNOSIS — M5440 Lumbago with sciatica, unspecified side: Secondary | ICD-10-CM | POA: Diagnosis not present

## 2022-05-14 DIAGNOSIS — M4316 Spondylolisthesis, lumbar region: Secondary | ICD-10-CM | POA: Diagnosis not present

## 2022-05-27 NOTE — Therapy (Unsigned)
OUTPATIENT PHYSICAL THERAPY THORACOLUMBAR EVALUATION   Patient Name: Samantha Burnett MRN: [id number] DOB:11-21-1960, 62 y.o., female Today's Date: 05/28/2022  END OF SESSION:  PT End of Session - 05/28/22 1307     Visit Number 1    Date for PT Re-Evaluation 07/23/22    PT Start Time 1103    PT Stop Time 1140    PT Time Calculation (min) 37 min    Activity Tolerance Patient limited by pain    Behavior During Therapy [facility] for tasks assessed/performed             Past Medical History:  Diagnosis Date   Anemia    Anxiety    Arthritis    Hypertension    Past Surgical History:  Procedure Laterality Date   bone spurs     back   COLONOSCOPY     [place]   Patient Active Problem List   Diagnosis Date Noted   Spondylolisthesis at L3-L4 level 07/29/2015   Obesity 08/19/2011   Hypertension 08/19/2011   S/P myomectomy 08/19/2011   Menopausal symptoms 08/19/2011   URI 04/16/2009   KNEE PAIN, RIGHT 03/14/2007    PCP: Seward Carol, MD  REFERRING PROVIDER: Eleonore Chiquito, NP   REFERRING DIAG: M43.16 (ICD-10-CM) - Spondylolisthesis, lumbar region   Rationale for Evaluation and Treatment: Rehabilitation  THERAPY DIAG:  Abnormal posture  Difficulty in walking, not elsewhere classified  Muscle weakness (generalized)  Other lack of coordination  Spondylolisthesis of lumbar region  Chronic radicular lumbar pain  ONSET DATE: 05/20/22  SUBJECTIVE:                                                                                                                                                                                           SUBJECTIVE STATEMENT: Patient reports L leg pain and numbness. Started about the second week of February. They did an X ray at the Dr office, but can not perform MRI until after she attends PT. She has been stumbling due to pain and difficulty moving the leg.  PERTINENT HISTORY:   Per referring physicain note: 05/14/22 1 week H/O pain radiating into L foot on top. Prednisone injection helped some with pain. No change in B & B H/O PLIF L3-4, L4-5  PAIN:  Are you having pain? Yes: NPRS scale: 8/10 Pain location: L lower back into ant L hip and down leg to ant foot. Pain description: sharp, stabbing in hip, continuous in her leg Aggravating factors: Stairs,  Relieving factors: nothing, the predisone helped mildly.  PRECAUTIONS: None  WEIGHT BEARING RESTRICTIONS: No  FALLS:  Has patient fallen in last 6 months? 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  MUSCLE LENGTH: Hamstrings: Right 75 deg; Left 55 deg   POSTURE: rounded shoulders, increased lumbar lordosis, decreased thoracic kyphosis, and weight shift right  PALPATION: TTP with burning over ant/medial thigh, entire lower leg on L  LUMBAR ROM: Deferred due to severity of pain with movement.  AROM eval  Flexion   Extension   Right lateral flexion   Left lateral flexion   Right rotation   Left rotation    (Blank rows = not tested)  LOWER EXTREMITY ROM:   R WFL, L hip movement limited as it  exacerbated symptoms of pain and burining in LLE.  LOWER EXTREMITY MMT:  Able to move LLE against gravity, but could not tolerate andy pressure to test beyond 3/5, RLE WNL   LUMBAR SPECIAL TESTS:  Straight leg raise test: Negative and Slump test: Unable to complete test due to pain with PPT.  FUNCTIONAL TESTS:  5 times sit to stand: 34.4 with burning.  GAIT: Distance walked: In clinic distances Assistive device utilized: None Level of assistance: Modified independence Comments: antalgic gait with weight maintained on her R foot. L foot slightly dragged at times.  TODAY'S TREATMENT:                                                                                                                              DATE:  05/28/22 Education    PATIENT EDUCATION:  Education details: POC Person educated: Patient Education method: Explanation Education comprehension: verbalized understanding  HOME EXERCISE PROGRAM: TBD  ASSESSMENT:  CLINICAL IMPRESSION: Patient is a 62 y.o. who was seen today for physical therapy evaluation and treatment for Lumbar spondylolisthesis with low back pain and pain radiating into L hip and anterior leg. She reports pain and burning radiating into her L leg all the way to the top of her foot, sharp pains in her L hip with WB. She is unable to fully WB through the L LE due to pain, demosntrates altered gait, dragging L foot. Assessment is limited due to severe pain. She may have some arthritis in her L hip  as evidenced by the sharp pains in her L groin with WB, but she reports that this pain came on at the same time as the radiating pain and numbness. She is at high fall risk due to her altered gait pattern as well at high risk to develop additional mechanical pain issues in her other joints due to her severely altered gait pattern. Patient reports that the stress and pain is causing her BP to rise, presenting additional challenges and risk of injury. She did undergo  PLIF several years ago at L3-4, so she has documented spinal column disease. She will benefit from PT for stretching and strengthening to all pelvic, LE, and trunk muscles to increase stability. However, her pain pattern does appear to be neurologically based and she would benefit from further assessment of her spine by the neuro surgeon in order to identify and changes.  OBJECTIVE IMPAIRMENTS: Abnormal gait, decreased activity tolerance, decreased balance, decreased coordination, decreased endurance, difficulty walking, decreased ROM, decreased strength, increased edema, increased muscle spasms, improper body mechanics, postural dysfunction, and prosthetic dependency .   ACTIVITY LIMITATIONS: carrying, lifting, bending, standing, squatting, sleeping, stairs, bathing, dressing, reach over head, and locomotion level  PARTICIPATION LIMITATIONS: meal prep, cleaning, laundry, shopping, community activity, and occupation  PERSONAL FACTORS: Past/current experiences are also affecting patient's functional outcome.   REHAB POTENTIAL: Good  CLINICAL DECISION MAKING: Evolving/moderate complexity  EVALUATION COMPLEXITY: High   GOALS: Goals reviewed with patient? Yes  SHORT TERM GOALS: Target date: 06/11/22  I with initial HEP Baseline: Goal status: INITIAL LONG TERM GOALS: Target date: 07/23/22  I with final HEP Baseline:  Goal status: INITIAL  2.  Decrease 5 x STS to < 12 sec Baseline: 39 Goal status: INITIAL  3.  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  Patient will safely climb steps with U rail, MI Baseline: Avoiding due to pain Goal status: INITIAL  5.  Pain free L LE strength of at least 4/5 Baseline: Unable to test due to pain, at least 3/5 Goal status: INITIAL  6.  Patient will walk with normal gait pattern, equal WB, stance, step length, pain < 3/10 Baseline:  Goal status:  INITIAL  PLAN:  PT FREQUENCY: 1-2x/week  PT DURATION: 8 weeks  PLANNED INTERVENTIONS: Therapeutic exercises, Therapeutic activity, Neuromuscular re-education, Balance training, Gait training, Patient/Family education, Self Care, Joint mobilization, Stair training, Dry Needling, Electrical stimulation, Spinal mobilization, Cryotherapy, Moist heat, Taping, Ionotophoresis '4mg'$ /ml Dexamethasone, and Manual therapy.  PLAN FOR NEXT SESSION: HEP, attempt to treat severe acute pain and increase mobility in spine and leg.   Marcelina Morel, DPT 05/28/2022, 1:23 PM

## 2022-05-28 ENCOUNTER — Encounter: Payer: Self-pay | Admitting: Physical Therapy

## 2022-05-28 ENCOUNTER — Ambulatory Visit: Payer: BC Managed Care – PPO | Attending: Student | Admitting: Physical Therapy

## 2022-05-28 DIAGNOSIS — R293 Abnormal posture: Secondary | ICD-10-CM | POA: Insufficient documentation

## 2022-05-28 DIAGNOSIS — G8929 Other chronic pain: Secondary | ICD-10-CM | POA: Diagnosis not present

## 2022-05-28 DIAGNOSIS — M4316 Spondylolisthesis, lumbar region: Secondary | ICD-10-CM

## 2022-05-28 DIAGNOSIS — M5416 Radiculopathy, lumbar region: Secondary | ICD-10-CM | POA: Insufficient documentation

## 2022-05-28 DIAGNOSIS — R262 Difficulty in walking, not elsewhere classified: Secondary | ICD-10-CM | POA: Diagnosis not present

## 2022-05-28 DIAGNOSIS — R278 Other lack of coordination: Secondary | ICD-10-CM | POA: Insufficient documentation

## 2022-05-28 DIAGNOSIS — M6281 Muscle weakness (generalized): Secondary | ICD-10-CM | POA: Diagnosis not present

## 2022-06-01 ENCOUNTER — Ambulatory Visit: Payer: BC Managed Care – PPO | Admitting: Physical Therapy

## 2022-06-01 ENCOUNTER — Encounter: Payer: Self-pay | Admitting: Physical Therapy

## 2022-06-01 DIAGNOSIS — M545 Low back pain, unspecified: Secondary | ICD-10-CM | POA: Diagnosis not present

## 2022-06-01 DIAGNOSIS — M4316 Spondylolisthesis, lumbar region: Secondary | ICD-10-CM | POA: Diagnosis not present

## 2022-06-01 DIAGNOSIS — R278 Other lack of coordination: Secondary | ICD-10-CM

## 2022-06-01 DIAGNOSIS — G8929 Other chronic pain: Secondary | ICD-10-CM

## 2022-06-01 DIAGNOSIS — R293 Abnormal posture: Secondary | ICD-10-CM

## 2022-06-01 DIAGNOSIS — R262 Difficulty in walking, not elsewhere classified: Secondary | ICD-10-CM | POA: Diagnosis not present

## 2022-06-01 DIAGNOSIS — M5416 Radiculopathy, lumbar region: Secondary | ICD-10-CM | POA: Diagnosis not present

## 2022-06-01 DIAGNOSIS — I10 Essential (primary) hypertension: Secondary | ICD-10-CM | POA: Diagnosis not present

## 2022-06-01 DIAGNOSIS — M6281 Muscle weakness (generalized): Secondary | ICD-10-CM

## 2022-06-01 NOTE — Therapy (Signed)
OUTPATIENT PHYSICAL THERAPY THORACOLUMBAR EVALUATION   Patient Name: Samantha Burnett MRN: [id number] DOB:06-13-1960, 62 y.o., female Today's Date: 06/01/2022  END OF SESSION:  PT End of Session - 06/01/22 1316     Visit Number 2    Date for PT Re-Evaluation 07/23/22    PT Start Time [id number]    PT Stop Time 1355    PT Time Calculation (min) 42 min    Activity Tolerance Patient limited by pain    Behavior During Therapy [facility] for tasks assessed/performed              Past Medical History:  Diagnosis Date   Anemia    Anxiety    Arthritis    Hypertension    Past Surgical History:  Procedure Laterality Date   bone spurs     back   COLONOSCOPY     [place]   Patient Active Problem List   Diagnosis Date Noted   Spondylolisthesis at L3-L4 level 07/29/2015   Obesity 08/19/2011   Hypertension 08/19/2011   S/P myomectomy 08/19/2011   Menopausal symptoms 08/19/2011   URI 04/16/2009   KNEE PAIN, RIGHT 03/14/2007    PCP: Seward Carol, MD  REFERRING PROVIDER: Eleonore Chiquito, NP   REFERRING DIAG: M43.16 (ICD-10-CM) - Spondylolisthesis, lumbar region   Rationale for Evaluation and Treatment: Rehabilitation  THERAPY DIAG:  Abnormal posture  Difficulty in walking, not elsewhere classified  Muscle weakness (generalized)  Other lack of coordination  Spondylolisthesis of lumbar region  Chronic radicular lumbar pain  ONSET DATE: 05/20/22  SUBJECTIVE:                                                                                                                                                                                           SUBJECTIVE STATEMENT: Patient reports no change in her level of pain.  PERTINENT HISTORY:  Per referring physicain note: 05/14/22 1 week H/O pain radiating into L foot on top. Prednisone injection helped some with pain. No change in B & B H/O PLIF L3-4, L4-5  PAIN:  Are  you having pain? Yes: NPRS scale: 8/10 Pain location: L lower back into ant L hip and down leg to ant foot. Pain description: sharp, stabbing in hip, continuous in her leg Aggravating factors: Stairs,  Relieving factors: nothing, the predisone helped mildly.  PRECAUTIONS: None  WEIGHT BEARING RESTRICTIONS: No  FALLS:  Has patient fallen in last 6 months? 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  MUSCLE LENGTH: Hamstrings: Right 75 deg; Left 55 deg   POSTURE: rounded shoulders, increased lumbar lordosis, decreased thoracic kyphosis, and weight shift right  PALPATION: TTP with burning over ant/medial thigh, entire lower leg on L  LUMBAR ROM: Deferred due to severity of pain with movement.  AROM eval  Flexion   Extension   Right lateral flexion   Left lateral flexion   Right rotation   Left rotation    (Blank rows = not tested)  LOWER EXTREMITY ROM:   R WFL, L hip movement limited as it exacerbated symptoms of pain and burining in LLE.  LOWER EXTREMITY MMT:  Able to move LLE against gravity, but could not tolerate andy pressure to test beyond 3/5, RLE WNL   LUMBAR  SPECIAL TESTS:  Straight leg raise test: Negative and Slump test: Unable to complete test due to pain with PPT.  FUNCTIONAL TESTS:  5 times sit to stand: 34.4 with burning.  GAIT: Distance walked: In clinic distances Assistive device utilized: None Level of assistance: Modified independence Comments: antalgic gait with weight maintained on her R foot. L foot slightly dragged at times.  TODAY'S TREATMENT:                                                                                                                              DATE:  06/01/22 Attempted NuStep at L4 for warm up. Had to stop after 3:00 due to increased burning and numbness in L LE. Moved to mat, attempted pelvic tilts in supine, R side lying clamshells, and hip ext, STM to L hip musculature- all caused increased numbness and pain in LLE. Lumbar traction- Initially 80#, but patient did not feel any pull. Increased to 120# x 12 minutes. Patient reported relief while on the traction. Once she stood, she reported pain and numbness returned.  05/28/22 Education    PATIENT EDUCATION:  Education details: POC Person educated: Patient Education method: Explanation Education comprehension: verbalized understanding  HOME EXERCISE PROGRAM: TBD  ASSESSMENT:  CLINICAL IMPRESSION: Patient reports no change in her pain and numbness. Attempted multiple treatment techniques, but they all increased pain and numbness in L leg. Provided traction x 10 minutes with patient reporting improved pain during the traction, but the pain returned as soon as  she stood again. She tried her TNS unit at home and felt some mild relief while it was on. Encouraged to try it again.  OBJECTIVE IMPAIRMENTS: Abnormal gait, decreased activity tolerance, decreased balance, decreased coordination, decreased endurance, difficulty walking, decreased ROM, decreased strength, increased edema, increased muscle spasms, improper body mechanics, postural dysfunction,  and prosthetic dependency .   ACTIVITY LIMITATIONS: carrying, lifting, bending, standing, squatting, sleeping, stairs, bathing, dressing, reach over head, and locomotion level  PARTICIPATION LIMITATIONS: meal prep, cleaning, laundry, shopping, community activity, and occupation  PERSONAL FACTORS: Past/current experiences are also affecting patient's functional outcome.   REHAB POTENTIAL: Good  CLINICAL DECISION MAKING: Evolving/moderate complexity  EVALUATION COMPLEXITY: High   GOALS: Goals reviewed with patient? Yes  SHORT TERM GOALS: Target date: 06/11/22  I with initial HEP Baseline: Goal status: 06/01/22-Unable to initiate due to all attempted exercises exacerbated pain.  LONG TERM GOALS: Target date: 07/23/22  I with final HEP Baseline:  Goal status: INITIAL  2.  Decrease 5 x STS to < 12 sec Baseline: 39 Goal status: INITIAL  3.  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  Patient will safely climb steps with U rail, MI Baseline: Avoiding due to pain Goal status: INITIAL  5.  Pain free L LE strength of at least 4/5 Baseline: Unable to test due to pain, at least 3/5 Goal status: INITIAL  6.  Patient will walk with normal gait pattern, equal WB, stance, step length, pain < 3/10 Baseline:  Goal status: INITIAL  PLAN:  PT FREQUENCY: 1-2x/week  PT DURATION: 8 weeks  PLANNED INTERVENTIONS: Therapeutic exercises, Therapeutic activity, Neuromuscular re-education, Balance training, Gait training, Patient/Family education, Self Care, Joint mobilization, Stair training, Dry Needling, Electrical stimulation, Spinal mobilization, Cryotherapy, Moist heat, Taping, Ionotophoresis '4mg'$ /ml Dexamethasone, and Manual therapy.  PLAN FOR NEXT SESSION: HEP, attempt to treat severe acute pain and increase mobility in spine and leg.   Marcelina Morel, DPT 06/01/2022, 3:00 PM

## 2022-06-04 ENCOUNTER — Ambulatory Visit: Payer: BC Managed Care – PPO | Admitting: Physical Therapy

## 2022-06-04 DIAGNOSIS — M6281 Muscle weakness (generalized): Secondary | ICD-10-CM

## 2022-06-04 DIAGNOSIS — R262 Difficulty in walking, not elsewhere classified: Secondary | ICD-10-CM | POA: Diagnosis not present

## 2022-06-04 DIAGNOSIS — R278 Other lack of coordination: Secondary | ICD-10-CM | POA: Diagnosis not present

## 2022-06-04 DIAGNOSIS — R293 Abnormal posture: Secondary | ICD-10-CM

## 2022-06-04 DIAGNOSIS — M4316 Spondylolisthesis, lumbar region: Secondary | ICD-10-CM | POA: Diagnosis not present

## 2022-06-04 DIAGNOSIS — G8929 Other chronic pain: Secondary | ICD-10-CM | POA: Diagnosis not present

## 2022-06-04 DIAGNOSIS — M5416 Radiculopathy, lumbar region: Secondary | ICD-10-CM | POA: Diagnosis not present

## 2022-06-04 NOTE — Therapy (Signed)
OUTPATIENT PHYSICAL THERAPY THORACOLUMBAR   Patient Name: Samantha Burnett MRN: [id number] DOB:[birth year], 62 y.o., female Today's Date: 06/04/2022  END OF SESSION:  PT End of Session - 06/04/22 1526     Visit Number 3    Date for PT Re-Evaluation 07/23/22    PT Start Time [id number]    PT Stop Time 1610    PT Time Calculation (min) 44 min              Past Medical History:  Diagnosis Date   Anemia    Anxiety    Arthritis    Hypertension    Past Surgical History:  Procedure Laterality Date   bone spurs     back   [place]OSCOPY     [place]   Patient Active Problem List   Diagnosis Date Noted   Spondylolisthesis at L3-L4 level 07/29/2015   Obesity 08/19/2011   Hypertension 08/19/2011   S/P myomectomy 08/19/2011   Menopausal symptoms 08/19/2011   URI 04/16/2009   KNEE PAIN, RIGHT 03/14/2007    PCP: Seward Carol, MD  REFERRING PROVIDER: Eleonore Chiquito, NP   REFERRING DIAG: M43.16 (ICD-10-CM) - Spondylolisthesis, lumbar region   Rationale for Evaluation and Treatment: Rehabilitation  THERAPY DIAG:  No diagnosis found.  ONSET DATE: 05/20/22  SUBJECTIVE:                                                                                                                                                                                           SUBJECTIVE STATEMENT: Saem- relief while on traction but as soon as I stood no changes  PERTINENT HISTORY:  Per referring physicain note: 05/14/22 1 week H/O pain radiating into L foot on top. Prednisone injection helped some with pain. No change in B & B H/O PLIF L3-4, L4-5  PAIN:  Are you having pain? Yes: NPRS scale: 8/10 Pain location: L lower back into ant L hip and down leg to ant foot. Pain description: sharp, stabbing in hip, continuous in her leg Aggravating factors: Stairs,  Relieving factors: nothing, the predisone helped mildly.  PRECAUTIONS:  None  WEIGHT BEARING RESTRICTIONS: No  FALLS:  Has patient fallen in last 6 months? 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  NEXT MD VISIT: 06/18/22-Dr Saintclair Halsted  OBJECTIVE:   DIAGNOSTIC  FINDINGS:  05/14/22-X ray showed no complication with previous surgery, good bone growth and alignment,DDD at L5-S1, no change since previous CT  SCREENING FOR RED FLAGS: Bowel or bladder incontinence: No Spinal tumors: No Cauda equina syndrome: No Compression fracture: No Abdominal aneurysm: No  COGNITION: Overall cognitive status: Within functional limits for tasks assessed     SENSATION: Light touch: Impaired  and Sore on L ant thigh, lower leg, medial foot.  MUSCLE LENGTH: Hamstrings: Right 75 deg; Left 55 deg   POSTURE: rounded shoulders, increased lumbar lordosis, decreased thoracic kyphosis, and weight shift right  PALPATION: TTP with burning over ant/medial thigh, entire lower leg on L  LUMBAR ROM: Deferred due to severity of pain with movement.  AROM eval  Flexion   Extension   Right lateral flexion   Left lateral flexion   Right rotation   Left rotation    (Blank rows = not tested)  LOWER EXTREMITY ROM:   R WFL, L hip movement limited as it exacerbated symptoms of pain and burining in LLE.  LOWER EXTREMITY MMT:  Able to move LLE against gravity, but could not tolerate andy pressure to test beyond 3/5, RLE WNL   LUMBAR SPECIAL TESTS:  Straight leg raise test: Negative and Slump test: Unable to complete test due to pain with PPT.  FUNCTIONAL TESTS:  5 times sit to stand: 34.4 with burning.  GAIT: Distance walked: In clinic distances Assistive device utilized: None Level of assistance:  Modified independence Comments: antalgic gait with weight maintained on her R foot. L foot slightly dragged at times.  TODAY'S TREATMENT:                                                                                                                              DATE:   06/04/22 Dyna disc pelvis ROM 10 x 4 way,LAQ ,hip flex and abd 10 each. Red tband scap stab  Standing resisted left shift Left wiith Tband REIS with various stances- difficulty standing with equal wt bearing, 75% wt on RT, lateteral shift increases pain, ext in = wt bearing increases pain/symptoms Mech Lumb traction 120# 15 min    06/01/22 Attempted NuStep at L4 for warm up. Had to stop after 3:00 due to increased burning and numbness in L LE. Moved to mat, attempted pelvic tilts in supine, R side lying clamshells, and hip ext, STM to L hip musculature- all caused increased numbness and pain in LLE. Lumbar traction- Initially 80#, but patient did not feel any pull. Increased to 120# x 12 minutes. Patient reported relief while on the traction. Once she stood, she reported pain and numbness returned.  05/28/22 Education    PATIENT EDUCATION:  Education details: POC Person educated: Patient Education method: Explanation Education comprehension: verbalized understanding  HOME EXERCISE PROGRAM: TBD  ASSESSMENT:  CLINICAL IMPRESSION: see tx above. Called Dr Saintclair Halsted Med Assistant to tell her NE in 3 tx and reprts symptoms will try to reprocess  through insurance and contact MD OBJECTIVE IMPAIRMENTS: Abnormal gait, decreased activity tolerance, decreased balance, decreased coordination, decreased endurance, difficulty walking, decreased ROM, decreased strength, increased edema, increased muscle spasms, improper body mechanics, postural dysfunction, and prosthetic dependency .   ACTIVITY LIMITATIONS: carrying, lifting, bending, standing, squatting, sleeping, stairs, bathing, dressing, reach over head, and locomotion  level  PARTICIPATION LIMITATIONS: meal prep, cleaning, laundry, shopping, community activity, and occupation  PERSONAL FACTORS: Past/current experiences are also affecting patient's functional outcome.   REHAB POTENTIAL: Good  CLINICAL DECISION MAKING: Evolving/moderate complexity  EVALUATION COMPLEXITY: High   GOALS: Goals reviewed with patient? Yes  SHORT TERM GOALS: Target date: 06/11/22  I with initial HEP Baseline: Goal status: 06/01/22-Unable to initiate due to all attempted exercises exacerbated pain.  LONG TERM GOALS: Target date: 07/23/22  I with final HEP Baseline:  Goal status: INITIAL  2.  Decrease 5 x STS to < 12 sec Baseline: 39 Goal status: INITIAL  3.  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  Patient will safely climb steps with U rail, MI Baseline: Avoiding due to pain Goal status: INITIAL  5.  Pain free L LE strength of at least 4/5 Baseline: Unable to test due to pain, at least 3/5 Goal status: INITIAL  6.  Patient will walk with normal gait pattern, equal WB, stance, step length, pain < 3/10 Baseline:  Goal status: INITIAL  PLAN:  PT FREQUENCY: 1-2x/week  PT DURATION: 8 weeks  PLANNED INTERVENTIONS: Therapeutic exercises, Therapeutic activity, Neuromuscular re-education, Balance training, Gait training, Patient/Family education, Self Care, Joint mobilization, Stair training, Dry Needling, Electrical stimulation, Spinal mobilization, Cryotherapy, Moist heat, Taping, Ionotophoresis '4mg'$ /ml Dexamethasone, and Manual therapy.  PLAN FOR NEXT SESSION: MD ot contact pt directly. If MRO approved may HOLD PT until after MRI  Patient Details  Name: Samantha Burnett MRN: [id number] Date of Birth: [birth year] Referring Provider:  Seward Carol, MD  Encounter Date: 06/04/2022   Laqueta Carina, PTA 06/04/2022, 3:27 PM  [facility] [place], [place], [address] Phone: [phone]   Fax:  [phone]

## 2022-06-10 ENCOUNTER — Ambulatory Visit: Payer: BC Managed Care – PPO

## 2022-06-10 DIAGNOSIS — R293 Abnormal posture: Secondary | ICD-10-CM

## 2022-06-10 DIAGNOSIS — R262 Difficulty in walking, not elsewhere classified: Secondary | ICD-10-CM

## 2022-06-10 DIAGNOSIS — G8929 Other chronic pain: Secondary | ICD-10-CM

## 2022-06-10 DIAGNOSIS — M6281 Muscle weakness (generalized): Secondary | ICD-10-CM

## 2022-06-10 DIAGNOSIS — R278 Other lack of coordination: Secondary | ICD-10-CM

## 2022-06-10 DIAGNOSIS — M4316 Spondylolisthesis, lumbar region: Secondary | ICD-10-CM

## 2022-06-10 DIAGNOSIS — M5416 Radiculopathy, lumbar region: Secondary | ICD-10-CM | POA: Diagnosis not present

## 2022-06-10 NOTE — Therapy (Signed)
OUTPATIENT PHYSICAL THERAPY THORACOLUMBAR   Patient Name: Samantha Burnett MRN: [id number] DOB:December 23, 1960, 62 y.o., female Today's Date: 06/10/2022  END OF SESSION:  PT End of Session - 06/10/22 1442     Visit Number 4    Date for PT Re-Evaluation 07/23/22    PT Start Time 1445    PT Stop Time [id number]    PT Time Calculation (min) 45 min              Past Medical History:  Diagnosis Date   Anemia    Anxiety    Arthritis    Hypertension    Past Surgical History:  Procedure Laterality Date   bone spurs     back   COLONOSCOPY     [place]   Patient Active Problem List   Diagnosis Date Noted   Spondylolisthesis at L3-L4 level 07/29/2015   Obesity 08/19/2011   Hypertension 08/19/2011   S/P myomectomy 08/19/2011   Menopausal symptoms 08/19/2011   URI 04/16/2009   KNEE PAIN, RIGHT 03/14/2007    PCP: Seward Carol, MD  REFERRING PROVIDER: Eleonore Chiquito, NP   REFERRING DIAG: M43.16 (ICD-10-CM) - Spondylolisthesis, lumbar region   Rationale for Evaluation and Treatment: Rehabilitation  THERAPY DIAG:  Abnormal posture  Muscle weakness (generalized)  Difficulty in walking, not elsewhere classified  Other lack of coordination  Chronic radicular lumbar pain  Spondylolisthesis of lumbar region  ONSET DATE: 05/20/22  SUBJECTIVE:                                                                                                                                                                                           SUBJECTIVE STATEMENT: The back feels the same.   PERTINENT HISTORY:  Per referring physicain note: 05/14/22 1 week H/O pain radiating into L foot on top. Prednisone injection helped some with pain. No change in B & B H/O PLIF L3-4, L4-5  PAIN:  Are you having pain? Yes: NPRS scale: 7/10 Pain location: L lower back into ant L hip and down leg to ant foot. Pain description: sharp,  stabbing in hip, continuous in her leg Aggravating factors: Stairs,  Relieving factors: nothing, the predisone helped mildly.  PRECAUTIONS: None  WEIGHT BEARING RESTRICTIONS: No  FALLS:  Has patient fallen in last 6 months? 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  MUSCLE LENGTH: Hamstrings: Right 75 deg; Left 55 deg   POSTURE: rounded shoulders, increased lumbar lordosis, decreased thoracic kyphosis, and weight shift right  PALPATION: TTP with burning over ant/medial thigh, entire lower leg on L  LUMBAR ROM: Deferred due to severity of pain with movement.  AROM eval  Flexion   Extension   Right lateral flexion   Left lateral flexion   Right rotation   Left rotation    (Blank rows = not tested)  LOWER EXTREMITY ROM:   R WFL, L hip movement limited as it exacerbated symptoms of pain and burining in LLE.  LOWER EXTREMITY MMT:  Able to move LLE against gravity, but could not tolerate andy pressure to test beyond 3/5, RLE WNL   LUMBAR SPECIAL TESTS:  Straight leg raise test: Negative and Slump test: Unable to complete test due to pain with PPT.  FUNCTIONAL TESTS:   5 times sit to stand: 34.4 with burning.  GAIT: Distance walked: In clinic distances Assistive device utilized: None Level of assistance: Modified independence Comments: antalgic gait with weight maintained on her R foot. L foot slightly dragged at times.  TODAY'S TREATMENT:                                                                                                                              DATE:  06/10/22 NuStep L3 x23mins  Pball roll outs x10 and x10 rotations  Dyna disc pelvis ROM 10 x 4 way Seated marching 20reps alternating LAQ 2x10  Supine passive stretching HS, glutes, piriformis on LLE Sidelying hip abduction x10  Red tband scap stab   06/04/22 Dyna disc pelvis ROM 10 x 4 way,LAQ ,hip flex and abd 10 each. Red tband scap stab  Standing resisted left shift Left wiith Tband REIS with various stances- difficulty standing with equal wt bearing, 75% wt on RT, lateteral shift increases pain, ext in = wt bearing increases pain/symptoms Mech Lumb traction 120# 15 min    06/01/22 Attempted NuStep at L4 for warm up. Had to stop after 3:00 due to increased burning and numbness in L LE. Moved to mat, attempted pelvic tilts in supine, R side lying clamshells, and hip ext, STM to L hip musculature- all caused increased numbness and pain in LLE. Lumbar traction- Initially 80#, but patient did not feel any pull. Increased to 120# x 12 minutes. Patient reported relief while on the  traction. Once she stood, she reported pain and numbness returned.  05/28/22 Education    PATIENT EDUCATION:  Education details: POC Person educated: Patient Education method: Explanation Education comprehension: verbalized understanding  HOME EXERCISE PROGRAM: TBD  ASSESSMENT:  CLINICAL IMPRESSION:  Patient continues to have numbness in her L leg and foot. She reports no changes with pain in her L hip and groin area. She c/o of pain with sidelying abd, SLR, and bridges but able to work through  them to do at least 10 reps of each.   OBJECTIVE IMPAIRMENTS: Abnormal gait, decreased activity tolerance, decreased balance, decreased coordination, decreased endurance, difficulty walking, decreased ROM, decreased strength, increased edema, increased muscle spasms, improper body mechanics, postural dysfunction, and prosthetic dependency .   ACTIVITY LIMITATIONS: carrying, lifting, bending, standing, squatting, sleeping, stairs, bathing, dressing, reach over head, and locomotion level  PARTICIPATION LIMITATIONS: meal prep, cleaning, laundry, shopping, community activity, and occupation  PERSONAL FACTORS: Past/current experiences are also affecting patient's functional outcome.   REHAB POTENTIAL: Good  CLINICAL DECISION MAKING: Evolving/moderate complexity  EVALUATION COMPLEXITY: High   GOALS: Goals reviewed with patient? Yes  SHORT TERM GOALS: Target date: 06/11/22  I with initial HEP Baseline: Goal status: 06/01/22-Unable to initiate due to all attempted exercises exacerbated pain.  LONG TERM GOALS: Target date: 07/23/22  I with final HEP Baseline:  Goal status: INITIAL  2.  Decrease 5 x STS to < 12 sec Baseline: 39 Goal status: INITIAL  3.  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  Patient will safely climb steps with U rail, MI Baseline: Avoiding due to pain Goal status: INITIAL  5.  Pain free L LE strength of at least 4/5 Baseline: Unable to test due to pain, at least 3/5 Goal status: INITIAL  6.  Patient will walk with normal gait pattern, equal WB, stance, step length, pain < 3/10 Baseline:  Goal status: INITIAL  PLAN:  PT FREQUENCY: 1-2x/week  PT DURATION: 8 weeks  PLANNED INTERVENTIONS: Therapeutic exercises, Therapeutic activity, Neuromuscular re-education, Balance training, Gait training, Patient/Family education, Self Care, Joint mobilization, Stair training, Dry  Needling, Electrical stimulation, Spinal mobilization, Cryotherapy, Moist heat, Taping, Ionotophoresis 4mg /ml Dexamethasone, and Manual therapy.  PLAN FOR NEXT SESSION: MD ot contact pt directly. If MRO approved may HOLD PT until after MRI  Patient Details  Name: Samantha Burnett MRN: [id number] Date of Birth: 03-11-1961 Referring Provider:  Seward Carol, MD  Encounter Date: 06/10/2022   Andris Baumann, PT 06/10/2022, 3:32 PM  [facility] [place]. [place], [place], [address] Phone: [phone]   Fax:  [phone]

## 2022-06-11 ENCOUNTER — Ambulatory Visit: Payer: BC Managed Care – PPO

## 2022-06-11 DIAGNOSIS — M5416 Radiculopathy, lumbar region: Secondary | ICD-10-CM | POA: Diagnosis not present

## 2022-06-11 DIAGNOSIS — G8929 Other chronic pain: Secondary | ICD-10-CM | POA: Diagnosis not present

## 2022-06-11 DIAGNOSIS — R293 Abnormal posture: Secondary | ICD-10-CM

## 2022-06-11 DIAGNOSIS — R278 Other lack of coordination: Secondary | ICD-10-CM | POA: Diagnosis not present

## 2022-06-11 DIAGNOSIS — M6281 Muscle weakness (generalized): Secondary | ICD-10-CM | POA: Diagnosis not present

## 2022-06-11 DIAGNOSIS — M4316 Spondylolisthesis, lumbar region: Secondary | ICD-10-CM

## 2022-06-11 DIAGNOSIS — R262 Difficulty in walking, not elsewhere classified: Secondary | ICD-10-CM

## 2022-06-11 NOTE — Therapy (Signed)
OUTPATIENT PHYSICAL THERAPY THORACOLUMBAR TREATMENT   Patient Name: Samantha Burnett MRN: [id number] DOB:October 11, 1960, 62 y.o., female Today's Date: 06/11/2022  END OF SESSION:  PT End of Session - 06/11/22 0929     Visit Number 5    Date for PT Re-Evaluation 07/23/22    PT Start Time 0930    PT Stop Time [id number]    PT Time Calculation (min) 45 min               Past Medical History:  Diagnosis Date   Anemia    Anxiety    Arthritis    Hypertension    Past Surgical History:  Procedure Laterality Date   bone spurs     back   COLONOSCOPY     EYE SURGERY     [place]   Patient Active Problem List   Diagnosis Date Noted   Spondylolisthesis at L3-L4 level 07/29/2015   Obesity 08/19/2011   Hypertension 08/19/2011   S/P myomectomy 08/19/2011   Menopausal symptoms 08/19/2011   URI 04/16/2009   KNEE PAIN, RIGHT 03/14/2007    PCP: Seward Carol, MD  REFERRING PROVIDER: Eleonore Chiquito, NP   REFERRING DIAG: M43.16 (ICD-10-CM) - Spondylolisthesis, lumbar region   Rationale for Evaluation and Treatment: Rehabilitation  THERAPY DIAG:  Difficulty in walking, not elsewhere classified  Abnormal posture  Muscle weakness (generalized)  Spondylolisthesis of lumbar region  Other lack of coordination  Chronic radicular lumbar pain  ONSET DATE: 05/20/22  SUBJECTIVE:                                                                                                                                                                                           SUBJECTIVE STATEMENT: I am hurting from yesterday, it is a constant pain and it is still numb and tingling. I get some burning pain past my knee  PERTINENT HISTORY:  Per referring physicain note: 05/14/22 1 week H/O pain radiating into L foot on top. Prednisone injection helped some with pain. No change in B & B H/O PLIF L3-4, L4-5  PAIN:  Are you having pain? Yes: NPRS  scale: 8/10 Pain location: L lower back into ant L hip and down leg to ant foot. Pain description: sharp, stabbing in hip, continuous in her leg Aggravating factors: Stairs,  Relieving factors: nothing, the predisone helped mildly.  PRECAUTIONS: None  WEIGHT BEARING RESTRICTIONS: No  FALLS:  Has patient fallen in last 6 months? No  LIVING ENVIRONMENT: Lives with: lives alone Lives in: House/apartment Stairs: Yes: External: 2 steps; none Has following  equipment at home: Single point cane  OCCUPATION: Professional chaplin-visits patients in facilities.  PLOF: Independent difficult due to pain  PATIENT GOALS: Improve her walking, decrease pain.  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  MUSCLE LENGTH: Hamstrings: Right 75 deg; Left 55 deg   POSTURE: rounded shoulders, increased lumbar lordosis, decreased thoracic kyphosis, and weight shift right  PALPATION: TTP with burning over ant/medial thigh, entire lower leg on L  LUMBAR ROM: Deferred due to severity of pain with movement.  AROM eval  Flexion   Extension   Right lateral flexion   Left lateral flexion   Right rotation   Left rotation    (Blank rows = not tested)  LOWER EXTREMITY ROM:   R WFL, L hip movement limited as it exacerbated symptoms of pain and burining in LLE.  LOWER EXTREMITY MMT:  Able to move LLE against gravity, but could not tolerate andy pressure to test beyond 3/5, RLE WNL   LUMBAR SPECIAL TESTS:  Straight  leg raise test: Negative and Slump test: Unable to complete test due to pain with PPT.  FUNCTIONAL TESTS:  5 times sit to stand: 34.4 with burning.  GAIT: Distance walked: In clinic distances Assistive device utilized: None Level of assistance: Modified independence Comments: antalgic gait with weight maintained on her R foot. L foot slightly dragged at times.  TODAY'S TREATMENT:                                                                                                                              DATE:  06/11/22 Supine stretching trunk rotations, SK2C, HS  Hip flexor stretch off table  Supine clamshells red 2x10 Hip flexor stretch off table  STS x10 Traction 120# 10 mins    06/10/22 NuStep L3 x57mins  Pball roll outs x10 and x10 rotations  Dyna disc pelvis ROM 10 x 4 way Seated marching 20reps alternating LAQ 2x10  Supine passive stretching HS, glutes, piriformis on LLE Sidelying hip abduction x10   06/04/22 Dyna disc pelvis ROM 10 x 4 way,LAQ ,hip flex and abd 10 each. Red tband scap stab  Standing resisted left shift Left wiith Tband REIS with various stances- difficulty standing with equal wt bearing, 75% wt on RT, lateteral shift increases pain, ext in = wt bearing increases pain/symptoms Mech Lumb traction 120# 15 min    06/01/22 Attempted NuStep at L4 for warm up. Had to stop after 3:00 due to increased burning and numbness in L LE. Moved to  mat, attempted pelvic tilts in supine, R side lying clamshells, and hip ext, STM to L hip musculature- all caused increased numbness and pain in LLE. Lumbar traction- Initially 80#, but patient did not feel any pull. Increased to 120# x 12 minutes. Patient reported relief while on the traction. Once she stood, she reported pain and numbness returned.  05/28/22 Education    PATIENT EDUCATION:  Education details: POC Person educated: Patient Education method: Explanation Education comprehension: verbalized  understanding  HOME EXERCISE PROGRAM: TBD  ASSESSMENT:  CLINICAL IMPRESSION:  Patient continues to have numbness in her L leg and foot. She reports soreness and pain in her L hip and groin area. Still guarded with most movements due to pain. Traction at end of visit to help with numbness and tingling in L leg. Patient will decrease visits to 1x/wk to reach 6wk to be able to get MRI.   OBJECTIVE IMPAIRMENTS: Abnormal gait, decreased activity tolerance, decreased balance, decreased coordination, decreased endurance, difficulty walking, decreased ROM, decreased strength, increased edema, increased muscle spasms, improper body mechanics, postural dysfunction, and prosthetic dependency .   ACTIVITY LIMITATIONS: carrying, lifting, bending, standing, squatting, sleeping, stairs, bathing, dressing, reach over head, and locomotion level  PARTICIPATION LIMITATIONS: meal prep, cleaning, laundry, shopping, community activity, and occupation  PERSONAL FACTORS: Past/current experiences are also affecting patient's functional outcome.   REHAB POTENTIAL: Good  CLINICAL DECISION MAKING: Evolving/moderate complexity  EVALUATION COMPLEXITY: High   GOALS: Goals reviewed with patient? Yes  SHORT TERM GOALS: Target date: 06/11/22  I with initial HEP Baseline: Goal status: 06/01/22-Unable to initiate due to all attempted exercises exacerbated pain.  LONG TERM GOALS: Target date: 07/23/22  I with final HEP Baseline:  Goal status: INITIAL  2.  Decrease 5 x STS to < 12 sec Baseline: 39 Goal status: INITIAL  3.  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  Patient will safely climb steps with U rail, MI Baseline: Avoiding due to pain Goal status: INITIAL  5.  Pain free L LE strength of at least 4/5 Baseline: Unable to test due to pain, at least 3/5 Goal status: INITIAL  6.  Patient will walk with normal  gait pattern, equal WB, stance, step length, pain < 3/10 Baseline:  Goal status: INITIAL  PLAN:  PT FREQUENCY: 1-2x/week  PT DURATION: 8 weeks  PLANNED INTERVENTIONS: Therapeutic exercises, Therapeutic activity, Neuromuscular re-education, Balance training, Gait training, Patient/Family education, Self Care, Joint mobilization, Stair training, Dry Needling, Electrical stimulation, Spinal mobilization, Cryotherapy, Moist heat, Taping, Ionotophoresis 4mg /ml Dexamethasone, and Manual therapy.  PLAN FOR NEXT SESSION: MD ot contact pt directly. If MRO approved may HOLD PT until after MRI  Patient Details  Name: Samantha Burnett MRN: [id number] Date of Birth: 10-May-1960 Referring Provider:  Seward Carol, MD  Encounter Date: 06/11/2022   Andris Baumann, PT 06/11/2022, 10:12 AM  [facility] [place]. [place], [place], [address] Phone: [phone]   Fax:  [phone]

## 2022-06-12 ENCOUNTER — Ambulatory Visit: Payer: BC Managed Care – PPO | Admitting: Physical Therapy

## 2022-06-16 ENCOUNTER — Ambulatory Visit: Payer: BC Managed Care – PPO | Admitting: Physical Therapy

## 2022-06-16 ENCOUNTER — Encounter: Payer: Self-pay | Admitting: Physical Therapy

## 2022-06-16 DIAGNOSIS — R278 Other lack of coordination: Secondary | ICD-10-CM

## 2022-06-16 DIAGNOSIS — M4316 Spondylolisthesis, lumbar region: Secondary | ICD-10-CM

## 2022-06-16 DIAGNOSIS — M5416 Radiculopathy, lumbar region: Secondary | ICD-10-CM | POA: Diagnosis not present

## 2022-06-16 DIAGNOSIS — R293 Abnormal posture: Secondary | ICD-10-CM

## 2022-06-16 DIAGNOSIS — G8929 Other chronic pain: Secondary | ICD-10-CM | POA: Diagnosis not present

## 2022-06-16 DIAGNOSIS — R262 Difficulty in walking, not elsewhere classified: Secondary | ICD-10-CM

## 2022-06-16 DIAGNOSIS — M6281 Muscle weakness (generalized): Secondary | ICD-10-CM | POA: Diagnosis not present

## 2022-06-16 NOTE — Therapy (Signed)
OUTPATIENT PHYSICAL THERAPY THORACOLUMBAR TREATMENT   Patient Name: Samantha Burnett MRN: [id number] DOB:07-06-1960, 62 y.o., female Today's Date: 06/16/2022  END OF SESSION:  PT End of Session - 06/16/22 0857     Visit Number 6    Date for PT Re-Evaluation 07/23/22    PT Start Time 0847    PT Stop Time 0925    PT Time Calculation (min) 38 min    Activity Tolerance Patient limited by pain    Behavior During Therapy [facility] for tasks assessed/performed               Past Medical History:  Diagnosis Date   Anemia    Anxiety    Arthritis    Hypertension    Past Surgical History:  Procedure Laterality Date   bone spurs     back   COLONOSCOPY     [place]   Patient Active Problem List   Diagnosis Date Noted   Spondylolisthesis at L3-L4 level 07/29/2015   Obesity 08/19/2011   Hypertension 08/19/2011   S/P myomectomy 08/19/2011   Menopausal symptoms 08/19/2011   URI 04/16/2009   KNEE PAIN, RIGHT 03/14/2007    PCP: Seward Carol, MD  REFERRING PROVIDER: Eleonore Chiquito, NP   REFERRING DIAG: M43.16 (ICD-10-CM) - Spondylolisthesis, lumbar region   Rationale for Evaluation and Treatment: Rehabilitation  THERAPY DIAG:  Difficulty in walking, not elsewhere classified  Abnormal posture  Muscle weakness (generalized)  Spondylolisthesis of lumbar region  Other lack of coordination  Chronic radicular lumbar pain  ONSET DATE: 05/20/22  SUBJECTIVE:                                                                                                                                                                                           SUBJECTIVE STATEMENT: Patient reports no change in her pain level.  PERTINENT HISTORY:  Per referring physicain note: 05/14/22 1 week H/O pain radiating into L foot on top. Prednisone injection helped some with pain. No change in B & B H/O PLIF L3-4, L4-5  PAIN:  Are  you having pain? Yes: NPRS scale: 8/10 Pain location: L lower back into ant L hip and down leg to ant foot. Pain description: sharp, stabbing in hip, continuous in her leg Aggravating factors: Stairs,  Relieving factors: nothing, the predisone helped mildly.  PRECAUTIONS: None  WEIGHT BEARING RESTRICTIONS: No  FALLS:  Has patient fallen in last 6 months? 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  MUSCLE LENGTH: Hamstrings: Right 75 deg; Left 55 deg   POSTURE: rounded shoulders, increased lumbar lordosis, decreased thoracic kyphosis, and weight shift right  PALPATION: TTP with burning over ant/medial thigh, entire lower leg on L  LUMBAR ROM: Deferred due to severity of pain with movement.  AROM eval  Flexion   Extension   Right lateral flexion   Left lateral flexion   Right rotation   Left rotation    (Blank rows = not tested)  LOWER EXTREMITY ROM:   R WFL, L hip movement limited as it exacerbated symptoms of pain and burining in LLE.  LOWER EXTREMITY MMT:  Able to move LLE against gravity, but could not tolerate andy pressure to test beyond 3/5, RLE WNL   LUMBAR  SPECIAL TESTS:  Straight leg raise test: Negative and Slump test: Unable to complete test due to pain with PPT.  FUNCTIONAL TESTS:  5 times sit to stand: 34.4 with burning.  GAIT: Distance walked: In clinic distances Assistive device utilized: None Level of assistance: Modified independence Comments: antalgic gait with weight maintained on her R foot. L foot slightly dragged at times.  TODAY'S TREATMENT:                                                                                                                              DATE:  3/36/24 Supine with legs over physioball- DKTC, LTR Supine with legs over physioball- pelvic tilt x 10, 5 sec holds, bridge with pelvic tilt, alternating lift each leg off the ball while stabilizing with the opposite leg x 10 each Supine clamshells against G tband resistance STM to L gluts and piriformis with IASTM and stretch. Initiated HEP  Mini squats x 10 reps.  06/11/22 Supine stretching trunk rotations, SK2C, HS  Hip flexor stretch off table  Supine clamshells red 2x10 Hip flexor stretch off table  STS x10 Traction 120# 10 mins    06/10/22 NuStep L3 x2mins  Pball roll outs x10 and x10 rotations  Dyna disc pelvis ROM 10 x 4 way Seated marching 20reps alternating LAQ 2x10  Supine passive stretching HS, glutes, piriformis on LLE Sidelying hip abduction x10   06/04/22 Dyna disc pelvis ROM 10 x 4 way,LAQ ,hip flex and abd 10 each. Red tband scap  stab  Standing resisted left shift Left wiith Tband REIS with various stances- difficulty standing with equal wt bearing, 75% wt on RT, lateteral shift increases pain, ext in = wt bearing increases pain/symptoms Mech Lumb traction 120# 15 min    06/01/22 Attempted NuStep at L4 for warm up. Had to stop after 3:00 due to increased burning and numbness in L LE. Moved to mat, attempted pelvic tilts in supine, R side lying clamshells, and hip ext, STM to L hip musculature- all caused increased numbness  and pain in LLE. Lumbar traction- Initially 80#, but patient did not feel any pull. Increased to 120# x 12 minutes. Patient reported relief while on the traction. Once she stood, she reported pain and numbness returned.  05/28/22 Education    PATIENT EDUCATION:  Education details: POC Person educated: Patient Education method: Explanation Education comprehension: verbalized understanding  HOME EXERCISE PROGRAM: TBD  ASSESSMENT:  CLINICAL IMPRESSION:  Patient reports no real changes. Provided deep STM to L gluts and piriformis followed by stretching, then strengthening to activate the muscles. Initiated HEP and also educated the patient how to perform self deep tissue massage with tennis ball or thera gun, which she has at home. Educated her that she may have some achiness in the are of deep tissue massage and to use ice for relief. Patient did report some relief in the radiating pain, but still notes both L groin pain and also the burning going around the front of the leg beyond knee.  OBJECTIVE IMPAIRMENTS: Abnormal gait, decreased activity tolerance, decreased balance, decreased coordination, decreased endurance, difficulty walking, decreased ROM, decreased strength, increased edema, increased muscle spasms, improper body mechanics, postural dysfunction, and prosthetic dependency .   ACTIVITY LIMITATIONS: carrying, lifting, bending, standing, squatting, sleeping, stairs, bathing, dressing, reach over head, and locomotion level  PARTICIPATION LIMITATIONS: meal prep, cleaning, laundry, shopping, community activity, and occupation  PERSONAL FACTORS: Past/current experiences are also affecting patient's functional outcome.   REHAB POTENTIAL: Good  CLINICAL DECISION MAKING: Evolving/moderate complexity  EVALUATION COMPLEXITY: High   GOALS: Goals reviewed with patient? Yes  SHORT TERM GOALS: Target date: 06/11/22  I with initial HEP Baseline: Goal status: 06/01/22-Unable to  initiate due to all attempted exercises exacerbated pain.  LONG TERM GOALS: Target date: 07/23/22  I with final HEP Baseline:  Goal status: INITIAL  2.  Decrease 5 x STS to < 12 sec Baseline: 39 Goal status: INITIAL  3.  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  Patient will safely climb steps with U rail, MI Baseline: Avoiding due to pain Goal status: INITIAL  5.  Pain free L LE strength of at least 4/5 Baseline: Unable to test due to pain, at least 3/5 Goal status: 06/16/22-ongoing  6.  Patient will walk with normal gait pattern, equal WB, stance, step length, pain < 3/10 Baseline:  Goal status: INITIAL  PLAN:  PT FREQUENCY: 1-2x/week  PT DURATION: 8 weeks  PLANNED INTERVENTIONS: Therapeutic exercises, Therapeutic activity, Neuromuscular re-education, Balance training, Gait training, Patient/Family education, Self Care, Joint mobilization, Stair training, Dry Needling, Electrical stimulation, Spinal mobilization, Cryotherapy, Moist heat, Taping, Ionotophoresis 4mg /ml Dexamethasone, and Manual therapy.  PLAN FOR NEXT SESSION: MD ot contact pt directly. If MRO approved may HOLD PT until after MRI  Patient Details  Name: Samantha Burnett MRN: [id number] Date of Birth: 1960/10/10 Referring Provider:  Seward Carol, MD  Encounter Date:  06/16/2022   Ethel Rana DPT 06/16/22 10:23 AM   [facility] [place]. [place], [place], [address] Phone: [phone]   Fax:  [phone]

## 2022-06-18 ENCOUNTER — Ambulatory Visit: Payer: BC Managed Care – PPO | Admitting: Physical Therapy

## 2022-06-18 DIAGNOSIS — M5440 Lumbago with sciatica, unspecified side: Secondary | ICD-10-CM | POA: Diagnosis not present

## 2022-06-22 ENCOUNTER — Ambulatory Visit: Payer: BC Managed Care – PPO | Admitting: Physical Therapy

## 2022-06-30 ENCOUNTER — Ambulatory Visit: Payer: BC Managed Care – PPO | Admitting: Physical Therapy

## 2022-06-30 DIAGNOSIS — M5127 Other intervertebral disc displacement, lumbosacral region: Secondary | ICD-10-CM | POA: Diagnosis not present

## 2022-06-30 DIAGNOSIS — M5440 Lumbago with sciatica, unspecified side: Secondary | ICD-10-CM | POA: Diagnosis not present

## 2022-07-02 DIAGNOSIS — Z6841 Body Mass Index (BMI) 40.0 and over, adult: Secondary | ICD-10-CM | POA: Diagnosis not present

## 2022-07-02 DIAGNOSIS — M5440 Lumbago with sciatica, unspecified side: Secondary | ICD-10-CM | POA: Diagnosis not present

## 2022-07-06 ENCOUNTER — Ambulatory Visit: Payer: BC Managed Care – PPO

## 2022-07-07 ENCOUNTER — Ambulatory Visit: Payer: BC Managed Care – PPO | Admitting: Physical Therapy

## 2022-07-10 DIAGNOSIS — M5416 Radiculopathy, lumbar region: Secondary | ICD-10-CM | POA: Diagnosis not present

## 2022-07-14 ENCOUNTER — Ambulatory Visit: Payer: BC Managed Care – PPO | Admitting: Physical Therapy

## 2022-07-20 ENCOUNTER — Ambulatory Visit: Payer: BC Managed Care – PPO | Admitting: Physical Therapy

## 2022-07-28 DIAGNOSIS — Z6841 Body Mass Index (BMI) 40.0 and over, adult: Secondary | ICD-10-CM | POA: Diagnosis not present

## 2022-07-28 DIAGNOSIS — M5416 Radiculopathy, lumbar region: Secondary | ICD-10-CM | POA: Diagnosis not present

## 2022-08-06 DIAGNOSIS — F419 Anxiety disorder, unspecified: Secondary | ICD-10-CM | POA: Diagnosis not present

## 2022-08-06 DIAGNOSIS — Z0000 Encounter for general adult medical examination without abnormal findings: Secondary | ICD-10-CM | POA: Diagnosis not present

## 2022-08-06 DIAGNOSIS — I10 Essential (primary) hypertension: Secondary | ICD-10-CM | POA: Diagnosis not present

## 2022-08-06 DIAGNOSIS — R600 Localized edema: Secondary | ICD-10-CM | POA: Diagnosis not present

## 2022-10-16 DIAGNOSIS — M5416 Radiculopathy, lumbar region: Secondary | ICD-10-CM | POA: Diagnosis not present

## 2022-11-24 DIAGNOSIS — M5416 Radiculopathy, lumbar region: Secondary | ICD-10-CM | POA: Diagnosis not present

## 2023-02-11 DIAGNOSIS — M545 Low back pain, unspecified: Secondary | ICD-10-CM | POA: Diagnosis not present

## 2023-02-11 DIAGNOSIS — I10 Essential (primary) hypertension: Secondary | ICD-10-CM | POA: Diagnosis not present

## 2023-02-11 DIAGNOSIS — G4733 Obstructive sleep apnea (adult) (pediatric): Secondary | ICD-10-CM | POA: Diagnosis not present

## 2023-03-15 DIAGNOSIS — M5431 Sciatica, right side: Secondary | ICD-10-CM | POA: Diagnosis not present

## 2023-03-15 DIAGNOSIS — M5432 Sciatica, left side: Secondary | ICD-10-CM | POA: Diagnosis not present

## 2023-03-15 DIAGNOSIS — Z01411 Encounter for gynecological examination (general) (routine) with abnormal findings: Secondary | ICD-10-CM | POA: Diagnosis not present

## 2023-03-15 DIAGNOSIS — Z124 Encounter for screening for malignant neoplasm of cervix: Secondary | ICD-10-CM | POA: Diagnosis not present

## 2023-04-09 DIAGNOSIS — Z1231 Encounter for screening mammogram for malignant neoplasm of breast: Secondary | ICD-10-CM | POA: Diagnosis not present

## 2023-05-31 DIAGNOSIS — M5416 Radiculopathy, lumbar region: Secondary | ICD-10-CM | POA: Diagnosis not present

## 2023-05-31 DIAGNOSIS — M25552 Pain in left hip: Secondary | ICD-10-CM | POA: Diagnosis not present

## 2023-06-30 DIAGNOSIS — M25562 Pain in left knee: Secondary | ICD-10-CM | POA: Diagnosis not present

## 2023-06-30 DIAGNOSIS — M545 Low back pain, unspecified: Secondary | ICD-10-CM | POA: Diagnosis not present

## 2023-06-30 DIAGNOSIS — M25561 Pain in right knee: Secondary | ICD-10-CM | POA: Diagnosis not present

## 2023-07-08 DIAGNOSIS — M545 Low back pain, unspecified: Secondary | ICD-10-CM | POA: Diagnosis not present

## 2023-07-08 DIAGNOSIS — M25561 Pain in right knee: Secondary | ICD-10-CM | POA: Diagnosis not present

## 2023-07-08 DIAGNOSIS — M25562 Pain in left knee: Secondary | ICD-10-CM | POA: Diagnosis not present

## 2023-07-13 DIAGNOSIS — M545 Low back pain, unspecified: Secondary | ICD-10-CM | POA: Diagnosis not present

## 2023-07-13 DIAGNOSIS — M25561 Pain in right knee: Secondary | ICD-10-CM | POA: Diagnosis not present

## 2023-07-13 DIAGNOSIS — M25562 Pain in left knee: Secondary | ICD-10-CM | POA: Diagnosis not present

## 2023-07-22 DIAGNOSIS — M25561 Pain in right knee: Secondary | ICD-10-CM | POA: Diagnosis not present

## 2023-07-22 DIAGNOSIS — M25562 Pain in left knee: Secondary | ICD-10-CM | POA: Diagnosis not present

## 2023-07-22 DIAGNOSIS — M545 Low back pain, unspecified: Secondary | ICD-10-CM | POA: Diagnosis not present

## 2023-07-29 DIAGNOSIS — M25562 Pain in left knee: Secondary | ICD-10-CM | POA: Diagnosis not present

## 2023-07-29 DIAGNOSIS — M545 Low back pain, unspecified: Secondary | ICD-10-CM | POA: Diagnosis not present

## 2023-07-29 DIAGNOSIS — M25561 Pain in right knee: Secondary | ICD-10-CM | POA: Diagnosis not present

## 2023-08-12 DIAGNOSIS — M25562 Pain in left knee: Secondary | ICD-10-CM | POA: Diagnosis not present

## 2023-08-12 DIAGNOSIS — M25561 Pain in right knee: Secondary | ICD-10-CM | POA: Diagnosis not present

## 2023-08-12 DIAGNOSIS — M545 Low back pain, unspecified: Secondary | ICD-10-CM | POA: Diagnosis not present

## 2023-08-27 DIAGNOSIS — M25572 Pain in left ankle and joints of left foot: Secondary | ICD-10-CM | POA: Diagnosis not present

## 2023-08-27 DIAGNOSIS — M79605 Pain in left leg: Secondary | ICD-10-CM | POA: Diagnosis not present

## 2023-08-27 DIAGNOSIS — S8012XA Contusion of left lower leg, initial encounter: Secondary | ICD-10-CM | POA: Diagnosis not present

## 2023-08-27 DIAGNOSIS — M79672 Pain in left foot: Secondary | ICD-10-CM | POA: Diagnosis not present

## 2023-09-09 DIAGNOSIS — M25561 Pain in right knee: Secondary | ICD-10-CM | POA: Diagnosis not present

## 2023-09-09 DIAGNOSIS — M545 Low back pain, unspecified: Secondary | ICD-10-CM | POA: Diagnosis not present

## 2023-09-09 DIAGNOSIS — M25562 Pain in left knee: Secondary | ICD-10-CM | POA: Diagnosis not present

## 2023-09-16 DIAGNOSIS — M25561 Pain in right knee: Secondary | ICD-10-CM | POA: Diagnosis not present

## 2023-09-16 DIAGNOSIS — M545 Low back pain, unspecified: Secondary | ICD-10-CM | POA: Diagnosis not present

## 2023-09-16 DIAGNOSIS — M25562 Pain in left knee: Secondary | ICD-10-CM | POA: Diagnosis not present

## 2023-09-30 DIAGNOSIS — M545 Low back pain, unspecified: Secondary | ICD-10-CM | POA: Diagnosis not present

## 2023-09-30 DIAGNOSIS — M25562 Pain in left knee: Secondary | ICD-10-CM | POA: Diagnosis not present

## 2023-09-30 DIAGNOSIS — M25561 Pain in right knee: Secondary | ICD-10-CM | POA: Diagnosis not present

## 2023-10-14 DIAGNOSIS — I10 Essential (primary) hypertension: Secondary | ICD-10-CM | POA: Diagnosis not present

## 2023-10-14 DIAGNOSIS — Z0000 Encounter for general adult medical examination without abnormal findings: Secondary | ICD-10-CM | POA: Diagnosis not present

## 2023-10-14 DIAGNOSIS — G4733 Obstructive sleep apnea (adult) (pediatric): Secondary | ICD-10-CM | POA: Diagnosis not present
